# Patient Record
Sex: Female | Born: 1955
Health system: Southern US, Community
[De-identification: ages and names within clinical notes are randomized; demographics above are authoritative.]

## PROBLEM LIST (undated history)

## (undated) DIAGNOSIS — E119 Type 2 diabetes mellitus without complications: Secondary | ICD-10-CM

## (undated) DIAGNOSIS — E7800 Pure hypercholesterolemia, unspecified: Secondary | ICD-10-CM

## (undated) DIAGNOSIS — I10 Essential (primary) hypertension: Secondary | ICD-10-CM

## (undated) HISTORY — DX: Type 2 diabetes mellitus without complications: E11.9

## (undated) HISTORY — PX: ABDOMINAL HYSTERECTOMY: SHX81

## (undated) HISTORY — DX: Pure hypercholesterolemia, unspecified: E78.00

## (undated) HISTORY — PX: CHOLECYSTECTOMY: SHX55

## (undated) HISTORY — DX: Essential (primary) hypertension: I10

---

## 2001-12-09 ENCOUNTER — Ambulatory Visit (HOSPITAL_COMMUNITY): Admission: RE | Admit: 2001-12-09 | Discharge: 2001-12-09 | Payer: Self-pay | Admitting: Obstetrics and Gynecology

## 2001-12-09 ENCOUNTER — Encounter: Payer: Self-pay | Admitting: Obstetrics and Gynecology

## 2002-07-13 ENCOUNTER — Inpatient Hospital Stay (HOSPITAL_COMMUNITY): Admission: RE | Admit: 2002-07-13 | Discharge: 2002-07-15 | Payer: Self-pay | Admitting: Obstetrics & Gynecology

## 2002-12-13 ENCOUNTER — Encounter: Payer: Self-pay | Admitting: Obstetrics & Gynecology

## 2002-12-13 ENCOUNTER — Ambulatory Visit (HOSPITAL_COMMUNITY): Admission: RE | Admit: 2002-12-13 | Discharge: 2002-12-13 | Payer: Self-pay | Admitting: Obstetrics & Gynecology

## 2003-12-21 ENCOUNTER — Ambulatory Visit (HOSPITAL_COMMUNITY): Admission: RE | Admit: 2003-12-21 | Discharge: 2003-12-21 | Payer: Self-pay | Admitting: Obstetrics & Gynecology

## 2004-08-03 ENCOUNTER — Ambulatory Visit (HOSPITAL_COMMUNITY): Admission: RE | Admit: 2004-08-03 | Discharge: 2004-08-03 | Payer: Self-pay | Admitting: Family Medicine

## 2004-12-25 ENCOUNTER — Ambulatory Visit (HOSPITAL_COMMUNITY): Admission: RE | Admit: 2004-12-25 | Discharge: 2004-12-25 | Payer: Self-pay | Admitting: Obstetrics & Gynecology

## 2005-12-26 ENCOUNTER — Ambulatory Visit (HOSPITAL_COMMUNITY): Admission: RE | Admit: 2005-12-26 | Discharge: 2005-12-26 | Payer: Self-pay | Admitting: Obstetrics & Gynecology

## 2006-12-29 ENCOUNTER — Ambulatory Visit (HOSPITAL_COMMUNITY): Admission: RE | Admit: 2006-12-29 | Discharge: 2006-12-29 | Payer: Self-pay | Admitting: Obstetrics & Gynecology

## 2007-12-30 ENCOUNTER — Ambulatory Visit (HOSPITAL_COMMUNITY): Admission: RE | Admit: 2007-12-30 | Discharge: 2007-12-30 | Payer: Self-pay | Admitting: Obstetrics & Gynecology

## 2008-12-30 ENCOUNTER — Ambulatory Visit (HOSPITAL_COMMUNITY): Admission: RE | Admit: 2008-12-30 | Discharge: 2008-12-30 | Payer: Self-pay | Admitting: Obstetrics & Gynecology

## 2009-02-07 ENCOUNTER — Ambulatory Visit (HOSPITAL_COMMUNITY): Admission: RE | Admit: 2009-02-07 | Discharge: 2009-02-07 | Payer: Self-pay | Admitting: Internal Medicine

## 2010-01-01 ENCOUNTER — Ambulatory Visit (HOSPITAL_COMMUNITY): Admission: RE | Admit: 2010-01-01 | Discharge: 2010-01-01 | Payer: Self-pay | Admitting: Obstetrics & Gynecology

## 2010-11-26 ENCOUNTER — Other Ambulatory Visit: Payer: Self-pay | Admitting: Obstetrics & Gynecology

## 2010-11-26 DIAGNOSIS — Z139 Encounter for screening, unspecified: Secondary | ICD-10-CM

## 2010-12-14 NOTE — Op Note (Signed)
NAMETESS, POTTS                           ACCOUNT NO.:  [id number]   MEDICAL RECORD NO.:  [id number]                   PATIENT TYPE:  AMB   LOCATION:  DAY                                  FACILITY:  [facility]   PHYSICIAN:  Lazaro Arms, M.D.                DATE OF BIRTH:  09-Oct-1955   DATE OF PROCEDURE:  07/13/2002  DATE OF DISCHARGE:                                 OPERATIVE REPORT   PREOPERATIVE DIAGNOSES:  1. A 16-week size fibroid uterus.  2. Dysmenorrhea.   POSTOPERATIVE DIAGNOSES:  1. A 16-week size fibroid uterus.  2. Dysmenorrhea.   PROCEDURE:  Abdominal hysterectomy and bilateral salpingo-oophorectomy.   SURGEON:  Lazaro Arms, M.D.   ANESTHESIA:  General endotracheal.   FINDINGS:  The patient had multiple large myomata of the uterus.  The  ovaries were quite small but appeared to be normal.  The rest of the  peritoneal cavity was normal.   DESCRIPTION OF PROCEDURE:  The patient was taken to the operating room and  placed in the supine position, where she underwent general endotracheal  anesthesia.  She was then froglegged, her Foley catheter was placed  sterilely, and her vagina was prepped as well.  The patient's legs were then  taken out of the frogleg position.  Her abdomen was prepped and draped in  the usual sterile fashion.  A Pfannenstiel incision was made and carried  down sharply to the rectus fascia, which was scored in the midline and  extended laterally.  The fascia was taken off the muscle superiorly and  inferiorly without difficulty.  The muscles were divided and the peritoneal  cavity was entered.  The Balfour self-retaining retractor was placed, and  the uterus was grasped in the cornua with two long Kocher clamps.  The upper  abdomen was packed away with moist lap pads and the patient placed in  Trendelenburg position.  The left round ligament was suture ligated and cut.  The vesicouterine serosa flap on the left was created.  An avascular  window  in the infundibulopelvic ligament was made and the infundibulopelvic  ligament was clamped, cut, and doubly suture ligated.  Both ovaries were  quite small, and felt she was near menopause.  We had discussed this  preoperatively.  I then skeletonized the vessels on the left.  There was a  large posterior myoma that was really distorting the anatomy down there, and  so I grasped it with a single-tooth tenaculum and elevated it out of the way  of the vessels.  The right round ligament was then suture ligated and cut.  The peritoneum was incised.  The vesicouterine serosa flap was created, and  it was joined with the area on the left.  An avascular window as identified  and the infundibulopelvic ligament was clamped, cut, and double suture  ligated with good hemostasis.  The uterine vessels were  skeletonized.  The  left uterine vessels were clamped, cut, and suture ligated.  The right  uterine vessels were clamped, cut, and suture ligated.  One additional  pedicle was taken down, the cervix through the cardinal ligament.  The  bladder was pushed well away out of the area of surgery.  At this point each  pedicle was clamped, cut, and suture ligated.  The uterus was then amputated  so we could have better visualization as the uterus was really occupying the  entire pelvis from sidewall to sidewall.  I then grasped the amputated  cervical stump.  Serial pedicles were made, the vagina was crossclamped, and  each of these pedicles was clamped, cut, and suture ligated.  Vaginal angle  sutures were placed.  The vagina was closed with interrupted figure-of-eight  sutures.  The cervix was handed off along with the other specimens.  The  vagina was hemostatic.  The pelvis was irrigated vigorously and all pedicles  inspected and found to be hemostatic.  The self-retaining retractor and the  lap pads were removed.  The muscles and peritoneum were reapproximated  loosely using 0 chromic, the fascia  was closed using 0 Vicryl running, the  subcutaneous tissue was irrigated and made hemostatic.  The skin was closed  using skin staples.  The patient tolerated the procedure well.  She  experienced 300 cc of blood loss.  She was taken to the recovery room in  good and stable condition.  All counts were correct, and the specimen was  sent to the lab.                                               Lazaro Arms, M.D.    Loraine Maple  D:  07/13/2002  T:  07/13/2002  Job:  [id number]

## 2010-12-14 NOTE — H&P (Signed)
   NAME:  Martha Solomon, Martha Solomon                           ACCOUNT NO.:  [id number]   MEDICAL RECORD NO.:  [id number]                   PATIENT TYPE:  AMB   LOCATION:  DAY                                  FACILITY:  [facility]   PHYSICIAN:  Lazaro Arms, M.D.                DATE OF BIRTH:  [birth year]   DATE OF ADMISSION:  DATE OF DISCHARGE:                                HISTORY & PHYSICAL   HISTORY OF PRESENT ILLNESS:  The patient is a 55 year old African-American  female who was admitted for enlarged fibroid uterus.  She has had basically  a stable-sized uterus for some time, approximately 16 weeks' size, but it is  becoming increasingly more uncomfortable both with chronic abdominal pain,  urinary tract symptoms, and bleeding and pain with bleeding.  As a result,  she is admitted for an abdominal hysterectomy and possible BSO depending on  the status of her ovaries at the time of surgery.   PAST MEDICAL HISTORY:  1. Hypercholesterolemia.  2. Seasonal allergies.   PAST SURGICAL HISTORY:  Laparoscopic cholecystectomy and a laparoscopic  tubal ligation.   ALLERGIES:  REGLAN and DIMETAPP.   MEDICATIONS:  1. Lipitor.  2. Occasional 0.5 mg of Xanax.   SOCIAL HISTORY:  She does not smoke, drink, or do drugs.  She works full-  time.   PHYSICAL EXAMINATION:  VITAL SIGNS:  Weight is 188 pounds.  Blood pressure  120/90.  HEENT:  Normal.  NECK:  Thyroid is unremarkable.  CHEST:  Lungs are clear.  CARDIAC:  Heart is regular rate and rhythm without murmurs, rubs, or  gallops.  BREASTS:  Without mass, discharge, skin changes.  ABDOMEN:  Benign, no hepatosplenomegaly or masses.  PELVIC:  She has normal external genitalia.  Vagina is pink and moist,  without discharge.  Cervix parous, without lesion.  The uterus is 16 weeks'  size.  Adnexa is negative.  EXTREMITIES:  Warm, no edema.  NEUROLOGIC:  Grossly intact.   IMPRESSION:  1. Enlarged fibroid uterus.  2. Increasing symptomatology.   PLAN:  The patient is admitted for abdominal hysterectomy.  She understands  the risks, benefits, indications, and alternatives and will proceed.  We  will do a bilateral salpingo-oophorectomy if it appears the ovaries are  starting to fail.                                                Lazaro Arms, M.D.    Loraine Maple  D:  07/12/2002  T:  07/12/2002  Job:  [id number]

## 2010-12-14 NOTE — Discharge Summary (Signed)
   NAMERAZIA, SCREWS                           ACCOUNT NO.:  [id number]   MEDICAL RECORD NO.:  [id number]                   PATIENT TYPE:  INP   LOCATION:  A418                                 FACILITY:  [facility]   PHYSICIAN:  Lazaro Arms, M.D.                DATE OF BIRTH:  [birth year]   DATE OF ADMISSION:  07/13/2002  DATE OF DISCHARGE:  07/15/2002                                 DISCHARGE SUMMARY   DISCHARGE DIAGNOSES:  1. Status post total abdominal hysterectomy and bilateral salpingo-     oophorectomy.  2. Unremarkable postoperative course.   PROCEDURE:  Total abdominal hysterectomy and bilateral salpingo-  oophorectomy.   BRIEF HISTORY:  Please refer to the transcribed history and physical and the  operative note for details on admission to the hospital.   HOSPITAL COURSE:  The patient was admitted postoperatively.  She had  unremarkable postoperative course.  She tolerated clear liquids and a  regular diet and voided without symptoms.  She ambulated without symptoms  and had return of normal bowel function.  Her abdominal examination was  benign.  Her incision was clean, dry and intact.  She was discharged to home  on the morning of postoperative day #2 in good and stable condition.   FOLLOW UP:  She will follow up in the office next Tuesday to have her  staples removed.  She is given instructions and precautions for return.   DISCHARGE MEDICATIONS:  She is given a prescription for Toradol and Tylox, a  Vivelle dot patches and Levaquin.                                               Lazaro Arms, M.D.    Loraine Maple  D:  07/15/2002  T:  07/15/2002  Job:  [id number]

## 2011-01-07 ENCOUNTER — Ambulatory Visit (HOSPITAL_COMMUNITY)
Admission: RE | Admit: 2011-01-07 | Discharge: 2011-01-07 | Disposition: A | Discharge status: Home / Self Care | Payer: 59 | Source: Ambulatory Visit | Attending: Obstetrics & Gynecology | Admitting: Obstetrics & Gynecology

## 2011-01-07 DIAGNOSIS — Z1231 Encounter for screening mammogram for malignant neoplasm of breast: Secondary | ICD-10-CM | POA: Insufficient documentation

## 2011-01-07 DIAGNOSIS — Z139 Encounter for screening, unspecified: Secondary | ICD-10-CM

## 2011-12-10 ENCOUNTER — Other Ambulatory Visit: Payer: Self-pay | Admitting: Obstetrics & Gynecology

## 2011-12-10 DIAGNOSIS — Z139 Encounter for screening, unspecified: Secondary | ICD-10-CM

## 2012-01-13 ENCOUNTER — Ambulatory Visit (HOSPITAL_COMMUNITY)
Admission: RE | Admit: 2012-01-13 | Discharge: 2012-01-13 | Disposition: A | Discharge status: Home / Self Care | Payer: 59 | Source: Ambulatory Visit | Attending: Obstetrics & Gynecology | Admitting: Obstetrics & Gynecology

## 2012-01-13 DIAGNOSIS — Z1231 Encounter for screening mammogram for malignant neoplasm of breast: Secondary | ICD-10-CM | POA: Insufficient documentation

## 2012-01-13 DIAGNOSIS — Z139 Encounter for screening, unspecified: Secondary | ICD-10-CM

## 2012-01-20 ENCOUNTER — Other Ambulatory Visit: Payer: Self-pay | Admitting: Obstetrics & Gynecology

## 2012-01-20 DIAGNOSIS — R928 Other abnormal and inconclusive findings on diagnostic imaging of breast: Secondary | ICD-10-CM

## 2012-02-05 ENCOUNTER — Ambulatory Visit (HOSPITAL_COMMUNITY)
Admission: RE | Admit: 2012-02-05 | Discharge: 2012-02-05 | Disposition: A | Discharge status: Home / Self Care | Payer: 59 | Source: Ambulatory Visit | Attending: Obstetrics & Gynecology | Admitting: Obstetrics & Gynecology

## 2012-02-05 ENCOUNTER — Other Ambulatory Visit (HOSPITAL_COMMUNITY): Payer: Self-pay | Admitting: Obstetrics & Gynecology

## 2012-02-05 DIAGNOSIS — R928 Other abnormal and inconclusive findings on diagnostic imaging of breast: Secondary | ICD-10-CM

## 2012-07-01 ENCOUNTER — Other Ambulatory Visit: Payer: Self-pay | Admitting: Obstetrics & Gynecology

## 2012-07-01 DIAGNOSIS — Z09 Encounter for follow-up examination after completed treatment for conditions other than malignant neoplasm: Secondary | ICD-10-CM

## 2012-08-12 ENCOUNTER — Ambulatory Visit (HOSPITAL_COMMUNITY)
Admission: RE | Admit: 2012-08-12 | Discharge: 2012-08-12 | Disposition: A | Discharge status: Home / Self Care | Payer: 59 | Source: Ambulatory Visit | Attending: Obstetrics & Gynecology | Admitting: Obstetrics & Gynecology

## 2012-08-12 ENCOUNTER — Other Ambulatory Visit: Payer: Self-pay | Admitting: Obstetrics & Gynecology

## 2012-08-12 DIAGNOSIS — Z09 Encounter for follow-up examination after completed treatment for conditions other than malignant neoplasm: Secondary | ICD-10-CM

## 2012-08-12 DIAGNOSIS — N63 Unspecified lump in unspecified breast: Secondary | ICD-10-CM | POA: Insufficient documentation

## 2013-01-27 ENCOUNTER — Other Ambulatory Visit: Payer: Self-pay | Admitting: Obstetrics & Gynecology

## 2013-01-27 DIAGNOSIS — R928 Other abnormal and inconclusive findings on diagnostic imaging of breast: Secondary | ICD-10-CM

## 2013-02-10 ENCOUNTER — Ambulatory Visit (HOSPITAL_COMMUNITY)
Admission: RE | Admit: 2013-02-10 | Discharge: 2013-02-10 | Disposition: A | Discharge status: Home / Self Care | Payer: 59 | Source: Ambulatory Visit | Attending: Obstetrics & Gynecology | Admitting: Obstetrics & Gynecology

## 2013-02-10 ENCOUNTER — Other Ambulatory Visit: Payer: Self-pay | Admitting: Obstetrics & Gynecology

## 2013-02-10 DIAGNOSIS — R928 Other abnormal and inconclusive findings on diagnostic imaging of breast: Secondary | ICD-10-CM | POA: Insufficient documentation

## 2013-03-05 ENCOUNTER — Ambulatory Visit (INDEPENDENT_AMBULATORY_CARE_PROVIDER_SITE_OTHER): Payer: Commercial Managed Care - PPO | Admitting: Obstetrics & Gynecology

## 2013-03-05 ENCOUNTER — Encounter: Payer: Self-pay | Admitting: Obstetrics & Gynecology

## 2013-03-05 VITALS — BP 128/80 | Ht 65.5 in | Wt 206.0 lb

## 2013-03-05 DIAGNOSIS — I10 Essential (primary) hypertension: Secondary | ICD-10-CM | POA: Insufficient documentation

## 2013-03-05 DIAGNOSIS — Z1212 Encounter for screening for malignant neoplasm of rectum: Secondary | ICD-10-CM

## 2013-03-05 DIAGNOSIS — Z01419 Encounter for gynecological examination (general) (routine) without abnormal findings: Secondary | ICD-10-CM

## 2013-03-05 MED ORDER — ESTRADIOL 0.1 MG/24HR TD PTTW: MEDICATED_PATCH | TRANSDERMAL | Status: DC

## 2013-03-05 NOTE — Progress Notes (Signed)
Patient ID: Martha Solomon, female   DOB: [birth year], 57 y.o.   MRN: [id number] Subjective:     Martha Solomon is a 57 y.o. female here for a routine exam.  No LMP recorded. Patient has had a hysterectomy. No obstetric history on file. Current complaints: none.  Personal health questionnaire reviewed: no.   Gynecologic History No LMP recorded. Patient has had a hysterectomy. Contraception: status post hysterectomy Last Pap: na. Results were: normal Last mammogram: 2014. Results were: normal  Obstetric History OB History   Grav Para Term Preterm Abortions TAB SAB Ect Mult Living                   The following portions of the patient's history were reviewed and updated as appropriate: allergies, current medications, past family history, past medical history, past social history, past surgical history and problem list.  Review of Systems  Review of Systems  Constitutional: Negative for fever, chills, weight loss, malaise/fatigue and diaphoresis.  HENT: Negative for hearing loss, ear pain, nosebleeds, congestion, sore throat, neck pain, tinnitus and ear discharge.   Eyes: Negative for blurred vision, double vision, photophobia, pain, discharge and redness.  Respiratory: Negative for cough, hemoptysis, sputum production, shortness of breath, wheezing and stridor.   Cardiovascular: Negative for chest pain, palpitations, orthopnea, claudication, leg swelling and PND.  Gastrointestinal: negative for abdominal pain. Negative for heartburn, nausea, vomiting, diarrhea, constipation, blood in stool and melena.  Genitourinary: Negative for dysuria, urgency, frequency, hematuria and flank pain.  Musculoskeletal: Negative for myalgias, back pain, joint pain and falls.  Skin: Negative for itching and rash.  Neurological: Negative for dizziness, tingling, tremors, sensory change, speech change, focal weakness, seizures, loss of consciousness, weakness and headaches.  Endo/Heme/Allergies: Negative  for environmental allergies and polydipsia. Does not bruise/bleed easily.  Psychiatric/Behavioral: Negative for depression, suicidal ideas, hallucinations, memory loss and substance abuse. The patient is not nervous/anxious and does not have insomnia.        Objective:    Physical Exam  Vitals reviewed. Constitutional: She is oriented to person, place, and time. She appears well-developed and well-nourished.  HENT:  Head: Normocephalic and atraumatic.        Right Ear: External ear normal.  Left Ear: External ear normal.  Nose: Nose normal.  Mouth/Throat: Oropharynx is clear and moist.  Eyes: Conjunctivae and EOM are normal. Pupils are equal, round, and reactive to light. Right eye exhibits no discharge. Left eye exhibits no discharge. No scleral icterus.  Neck: Normal range of motion. Neck supple. No tracheal deviation present. No thyromegaly present.  Cardiovascular: Normal rate, regular rhythm, normal heart sounds and intact distal pulses.  Exam reveals no gallop and no friction rub.   No murmur heard. Respiratory: Effort normal and breath sounds normal. No respiratory distress. She has no wheezes. She has no rales. She exhibits no tenderness.  GI: Soft. Bowel sounds are normal. She exhibits no distension and no mass. There is no tenderness. There is no rebound and no guarding.  Genitourinary:       Vulva is normal without lesions Vagina is pink moist without discharge Cervix surgically absent Uterus is absent Adnexa is negative  Rectal negative HemeOccult, no masses normal tone Musculoskeletal: Normal range of motion. She exhibits no edema and no tenderness.  Neurological: She is alert and oriented to person, place, and time. She has normal reflexes. She displays normal reflexes. No cranial nerve deficit. She exhibits normal muscle tone. Coordination normal.  Skin: Skin is warm  and dry. No rash noted. No erythema. No pallor.  Psychiatric: She has a normal mood and affect. Her  behavior is normal. Judgment and thought content normal.       Assessment:    Healthy female exam.    Plan:    Hormone replacement therapy: hormone replacement therapy: vivelle dot. Follow up in: 1 year.

## 2014-01-05 ENCOUNTER — Other Ambulatory Visit: Payer: Self-pay | Admitting: Obstetrics & Gynecology

## 2014-01-05 DIAGNOSIS — Z139 Encounter for screening, unspecified: Secondary | ICD-10-CM

## 2014-02-11 ENCOUNTER — Ambulatory Visit (HOSPITAL_COMMUNITY): Payer: 59

## 2014-02-11 ENCOUNTER — Ambulatory Visit (HOSPITAL_COMMUNITY)
Admission: RE | Admit: 2014-02-11 | Discharge: 2014-02-11 | Disposition: A | Discharge status: Home / Self Care | Payer: 59 | Source: Ambulatory Visit | Attending: Obstetrics & Gynecology | Admitting: Obstetrics & Gynecology

## 2014-02-11 DIAGNOSIS — Z1231 Encounter for screening mammogram for malignant neoplasm of breast: Secondary | ICD-10-CM | POA: Insufficient documentation

## 2014-02-11 DIAGNOSIS — Z139 Encounter for screening, unspecified: Secondary | ICD-10-CM

## 2014-03-10 ENCOUNTER — Other Ambulatory Visit: Payer: Self-pay | Admitting: Obstetrics & Gynecology

## 2014-03-10 ENCOUNTER — Encounter: Payer: Self-pay | Admitting: Obstetrics & Gynecology

## 2014-03-10 ENCOUNTER — Ambulatory Visit (INDEPENDENT_AMBULATORY_CARE_PROVIDER_SITE_OTHER): Payer: 59 | Admitting: Obstetrics & Gynecology

## 2014-03-10 VITALS — BP 140/90 | Ht 65.2 in | Wt 202.0 lb

## 2014-03-10 DIAGNOSIS — Z1212 Encounter for screening for malignant neoplasm of rectum: Secondary | ICD-10-CM

## 2014-03-10 DIAGNOSIS — Z01419 Encounter for gynecological examination (general) (routine) without abnormal findings: Secondary | ICD-10-CM

## 2014-03-10 MED ORDER — ESTRADIOL 0.1 MG/24HR TD PTTW: MEDICATED_PATCH | TRANSDERMAL | Status: DC

## 2014-03-10 NOTE — Progress Notes (Signed)
Patient ID: Martha Solomon, female   DOB: Nov 27, 1955, 58 y.o.   MRN: [id number] Subjective:     Martha Solomon is a 58 y.o. female here for a routine exam.  No LMP recorded. Patient has had a hysterectomy. No obstetric history on file. Birth Control Method:  hysterectomy Menstrual Calendar(currently): na  Current complaints: none.   Current acute medical issues:  none   Recent Gynecologic History No LMP recorded. Patient has had a hysterectomy. Last Pap: na,   Last mammogram: 2015,  normal  Past Medical History  Diagnosis Date  . Hypertension     Past Surgical History  Procedure Laterality Date  . Abdominal hysterectomy    . Cholecystectomy      OB History   Grav Para Term Preterm Abortions TAB SAB Ect Mult Living                  History   Social History  . Marital Status: Married    Spouse Name: N/A    Number of Children: N/A  . Years of Education: N/A   Social History Main Topics  . Smoking status: Never Smoker   . Smokeless tobacco: None  . Alcohol Use: No     Comment: occassional  . Drug Use: No  . Sexual Activity: Yes   Other Topics Concern  . None   Social History Narrative  . None    Family History  Problem Relation Age of Onset  . Cancer Paternal Aunt     ovarian, breast and pancreatic     Review of Systems  Review of Systems  Constitutional: Negative for fever, chills, weight loss, malaise/fatigue and diaphoresis.  HENT: Negative for hearing loss, ear pain, nosebleeds, congestion, sore throat, neck pain, tinnitus and ear discharge.   Eyes: Negative for blurred vision, double vision, photophobia, pain, discharge and redness.  Respiratory: Negative for cough, hemoptysis, sputum production, shortness of breath, wheezing and stridor.   Cardiovascular: Negative for chest pain, palpitations, orthopnea, claudication, leg swelling and PND.  Gastrointestinal: negative for abdominal pain. Negative for heartburn, nausea, vomiting, diarrhea,  constipation, blood in stool and melena.  Genitourinary: Negative for dysuria, urgency, frequency, hematuria and flank pain.  Musculoskeletal: Negative for myalgias, back pain, joint pain and falls.  Skin: Negative for itching and rash.  Neurological: Negative for dizziness, tingling, tremors, sensory change, speech change, focal weakness, seizures, loss of consciousness, weakness and headaches.  Endo/Heme/Allergies: Negative for environmental allergies and polydipsia. Does not bruise/bleed easily.  Psychiatric/Behavioral: Negative for depression, suicidal ideas, hallucinations, memory loss and substance abuse. The patient is not nervous/anxious and does not have insomnia.        Objective:    Physical Exam  Vitals reviewed. Constitutional: She is oriented to person, place, and time. She appears well-developed and well-nourished.  HENT:  Head: Normocephalic and atraumatic.        Right Ear: External ear normal.  Left Ear: External ear normal.  Nose: Nose normal.  Mouth/Throat: Oropharynx is clear and moist.  Eyes: Conjunctivae and EOM are normal. Pupils are equal, round, and reactive to light. Right eye exhibits no discharge. Left eye exhibits no discharge. No scleral icterus.  Neck: Normal range of motion. Neck supple. No tracheal deviation present. No thyromegaly present.  Cardiovascular: Normal rate, regular rhythm, normal heart sounds and intact distal pulses.  Exam reveals no gallop and no friction rub.   No murmur heard. Respiratory: Effort normal and breath sounds normal. No respiratory distress. She has no wheezes.  She has no rales. She exhibits no tenderness.  GI: Soft. Bowel sounds are normal. She exhibits no distension and no mass. There is no tenderness. There is no rebound and no guarding.  Genitourinary:  Breasts no masses skin changes or nipple changes bilaterally      Vulva is normal without lesions Vagina is pink moist without discharge Cervix absent Uterus is  absent Adnexa is negative absent Rectal    hemoccult negative, normal tone, no masses  Musculoskeletal: Normal range of motion. She exhibits no edema and no tenderness.  Neurological: She is alert and oriented to person, place, and time. She has normal reflexes. She displays normal reflexes. No cranial nerve deficit. She exhibits normal muscle tone. Coordination normal.  Skin: Skin is warm and dry. No rash noted. No erythema. No pallor.  Psychiatric: She has a normal mood and affect. Her behavior is normal. Judgment and thought content normal.       Assessment:    Healthy female exam.    Plan:    Mammogram ordered. Follow up in: 1 year.

## 2015-01-09 ENCOUNTER — Other Ambulatory Visit: Payer: Self-pay | Admitting: Obstetrics & Gynecology

## 2015-01-09 DIAGNOSIS — Z1231 Encounter for screening mammogram for malignant neoplasm of breast: Secondary | ICD-10-CM

## 2015-02-13 ENCOUNTER — Other Ambulatory Visit (HOSPITAL_COMMUNITY): Payer: Self-pay | Admitting: Family Medicine

## 2015-02-13 ENCOUNTER — Ambulatory Visit (HOSPITAL_COMMUNITY)
Admission: RE | Admit: 2015-02-13 | Discharge: 2015-02-13 | Disposition: A | Discharge status: Home / Self Care | Payer: Commercial Managed Care - HMO | Source: Ambulatory Visit | Attending: Family Medicine | Admitting: Family Medicine

## 2015-02-13 DIAGNOSIS — R0989 Other specified symptoms and signs involving the circulatory and respiratory systems: Secondary | ICD-10-CM | POA: Insufficient documentation

## 2015-02-13 DIAGNOSIS — R05 Cough: Secondary | ICD-10-CM | POA: Diagnosis present

## 2015-02-13 DIAGNOSIS — J069 Acute upper respiratory infection, unspecified: Secondary | ICD-10-CM

## 2015-02-13 DIAGNOSIS — R059 Cough, unspecified: Secondary | ICD-10-CM

## 2015-02-15 ENCOUNTER — Ambulatory Visit (HOSPITAL_COMMUNITY)
Admission: RE | Admit: 2015-02-15 | Discharge: 2015-02-15 | Disposition: A | Discharge status: Home / Self Care | Payer: Commercial Managed Care - HMO | Source: Ambulatory Visit | Attending: Obstetrics & Gynecology | Admitting: Obstetrics & Gynecology

## 2015-02-15 DIAGNOSIS — Z1231 Encounter for screening mammogram for malignant neoplasm of breast: Secondary | ICD-10-CM | POA: Diagnosis present

## 2015-03-16 ENCOUNTER — Ambulatory Visit (INDEPENDENT_AMBULATORY_CARE_PROVIDER_SITE_OTHER): Payer: Commercial Managed Care - HMO | Admitting: Obstetrics & Gynecology

## 2015-03-16 ENCOUNTER — Encounter: Payer: Self-pay | Admitting: Obstetrics & Gynecology

## 2015-03-16 VITALS — BP 128/80 | HR 60 | Ht 65.5 in | Wt 202.5 lb

## 2015-03-16 DIAGNOSIS — Z1212 Encounter for screening for malignant neoplasm of rectum: Secondary | ICD-10-CM

## 2015-03-16 DIAGNOSIS — Z01419 Encounter for gynecological examination (general) (routine) without abnormal findings: Secondary | ICD-10-CM

## 2015-03-16 DIAGNOSIS — Z1211 Encounter for screening for malignant neoplasm of colon: Secondary | ICD-10-CM

## 2015-03-16 MED ORDER — ESTRADIOL 0.1 MG/24HR TD PTTW: MEDICATED_PATCH | TRANSDERMAL | Status: DC

## 2015-03-16 NOTE — Progress Notes (Signed)
Patient ID: Martha Solomon, female   DOB: Mar 16, 1956, 59 y.o.   MRN: [id number] Subjective:     Martha Solomon is a 59 y.o. female here for a routine exam.  No LMP recorded. Patient has had a hysterectomy. No obstetric history on file. Birth Control Method:  na Menstrual Calendar(currently): na  Current complaints: none.   Current acute medical issues:  Reactive airway disease   Recent Gynecologic History No LMP recorded. Patient has had a hysterectomy. Last Pap: ,   Last mammogram: 2016,  normal  Past Medical History  Diagnosis Date  . Hypertension     Past Surgical History  Procedure Laterality Date  . Abdominal hysterectomy    . Cholecystectomy      OB History    No data available      Social History   Social History  . Marital Status: Married    Spouse Name: N/A  . Number of Children: N/A  . Years of Education: N/A   Social History Main Topics  . Smoking status: Never Smoker   . Smokeless tobacco: Never Used  . Alcohol Use: No     Comment: occassional  . Drug Use: No  . Sexual Activity: Yes   Other Topics Concern  . None   Social History Narrative    Family History  Problem Relation Age of Onset  . Cancer Paternal Aunt     ovarian, breast and pancreatic  . Cancer Father     lung  . Stroke Father      Current outpatient prescriptions:  .  albuterol (PROVENTIL) 4 MG tablet, Take 4 mg by mouth 2 (two) times daily. , Disp: , Rfl:  .  atorvastatin (LIPITOR) 20 MG tablet, Take 20 mg by mouth daily., Disp: , Rfl:  .  estradiol (VIVELLE-DOT) 0.1 MG/24HR patch, CHANGE PATCH TWICE WEEKLY, Disp: 8 patch, Rfl: 12 .  KRILL OIL PO, Take 1 capsule by mouth daily., Disp: , Rfl:  .  ASTAXANTHIN PO, Take 10 mg by mouth daily., Disp: , Rfl:  .  lisinopril-hydrochlorothiazide (PRINZIDE,ZESTORETIC) 10-12.5 MG per tablet, Take 1 tablet by mouth daily., Disp: , Rfl:  .  losartan-hydrochlorothiazide (HYZAAR) 100-12.5 MG per tablet, Take 1 tablet by mouth daily. ,  Disp: , Rfl:   Review of Systems  Review of Systems  Constitutional: Negative for fever, chills, weight loss, malaise/fatigue and diaphoresis.  HENT: Negative for hearing loss, ear pain, nosebleeds, congestion, sore throat, neck pain, tinnitus and ear discharge.   Eyes: Negative for blurred vision, double vision, photophobia, pain, discharge and redness.  Respiratory: Negative for cough, hemoptysis, sputum production, shortness of breath, wheezing and stridor.   Cardiovascular: Negative for chest pain, palpitations, orthopnea, claudication, leg swelling and PND.  Gastrointestinal: negative for abdominal pain. Negative for heartburn, nausea, vomiting, diarrhea, constipation, blood in stool and melena.  Genitourinary: Negative for dysuria, urgency, frequency, hematuria and flank pain.  Musculoskeletal: Negative for myalgias, back pain, joint pain and falls.  Skin: Negative for itching and rash.  Neurological: Negative for dizziness, tingling, tremors, sensory change, speech change, focal weakness, seizures, loss of consciousness, weakness and headaches.  Endo/Heme/Allergies: Negative for environmental allergies and polydipsia. Does not bruise/bleed easily.  Psychiatric/Behavioral: Negative for depression, suicidal ideas, hallucinations, memory loss and substance abuse. The patient is not nervous/anxious and does not have insomnia.        Objective:  Blood pressure 128/80, pulse 60, height 5' 5.5" (1.664 m), weight 202 lb 8 oz (91.853 kg).   Physical  Exam  Vitals reviewed. Constitutional: She is oriented to person, place, and time. She appears well-developed and well-nourished.  HENT:  Head: Normocephalic and atraumatic.        Right Ear: External ear normal.  Left Ear: External ear normal.  Nose: Nose normal.  Mouth/Throat: Oropharynx is clear and moist.  Eyes: Conjunctivae and EOM are normal. Pupils are equal, round, and reactive to light. Right eye exhibits no discharge. Left eye  exhibits no discharge. No scleral icterus.  Neck: Normal range of motion. Neck supple. No tracheal deviation present. No thyromegaly present.  Cardiovascular: Normal rate, regular rhythm, normal heart sounds and intact distal pulses.  Exam reveals no gallop and no friction rub.   No murmur heard. Respiratory: Effort normal and breath sounds normal. No respiratory distress. She has no wheezes. She has no rales. She exhibits no tenderness.   GI: Soft. Bowel sounds are normal. She exhibits no distension and no mass. There is no tenderness. There is no rebound and no guarding.  Genitourinary:  Breasts no masses skin changes or nipple changes bilaterally      Vulva is normal without lesions Vagina is pink moist without discharge Cervix absent Uterus is absent Adnexa is negative  {Rectal    hemoccult negative, normal tone, no masses Musculoskeletal: Normal range of motion. She exhibits no edema and no tenderness.  Neurological: She is alert and oriented to person, place, and time. She has normal reflexes. She displays normal reflexes. No cranial nerve deficit. She exhibits normal muscle tone. Coordination normal.  Skin: Skin is warm and dry. No rash noted. No erythema. No pallor.  Psychiatric: She has a normal mood and affect. Her behavior is normal. Judgment and thought content normal.       Assessment:    Healthy female exam.    Plan:    Hormone replacement therapy: hormone replacement therapy: vivelle dot 0.1 mg. Mammogram ordered. Follow up in: 1 year.

## 2016-01-01 ENCOUNTER — Other Ambulatory Visit: Payer: Self-pay | Admitting: Obstetrics & Gynecology

## 2016-01-01 DIAGNOSIS — Z1231 Encounter for screening mammogram for malignant neoplasm of breast: Secondary | ICD-10-CM

## 2016-02-19 ENCOUNTER — Ambulatory Visit (HOSPITAL_COMMUNITY)
Admission: RE | Admit: 2016-02-19 | Discharge: 2016-02-19 | Disposition: A | Discharge status: Home / Self Care | Payer: Commercial Managed Care - HMO | Source: Ambulatory Visit | Attending: Obstetrics & Gynecology | Admitting: Obstetrics & Gynecology

## 2016-02-19 DIAGNOSIS — Z1231 Encounter for screening mammogram for malignant neoplasm of breast: Secondary | ICD-10-CM | POA: Diagnosis present

## 2016-03-15 ENCOUNTER — Other Ambulatory Visit: Payer: Self-pay | Admitting: Obstetrics & Gynecology

## 2016-03-21 ENCOUNTER — Ambulatory Visit (INDEPENDENT_AMBULATORY_CARE_PROVIDER_SITE_OTHER): Payer: Commercial Managed Care - HMO | Admitting: Obstetrics & Gynecology

## 2016-03-21 ENCOUNTER — Encounter: Payer: Self-pay | Admitting: Obstetrics & Gynecology

## 2016-03-21 VITALS — BP 126/80 | HR 80 | Ht 65.2 in | Wt 204.0 lb

## 2016-03-21 DIAGNOSIS — Z01419 Encounter for gynecological examination (general) (routine) without abnormal findings: Secondary | ICD-10-CM

## 2016-03-21 MED ORDER — TERCONAZOLE 0.4 % VA CREA: 1.0000 | TOPICAL_CREAM | Freq: Every day | VAGINAL | 0 refills | Status: DC

## 2016-03-21 MED ORDER — VIVELLE-DOT 0.1 MG/24HR TD PTTW: MEDICATED_PATCH | TRANSDERMAL | 12 refills | Status: DC

## 2016-03-21 NOTE — Progress Notes (Signed)
Subjective:     Martha Solomon is a 60 y.o. female here for a routine exam.  No LMP recorded. Patient has had a hysterectomy. No obstetric history on file. Birth Control Method:  hysterectomy Menstrual Calendar(currently): none  Current complaints: yeast infection.   Current acute medical issues:  none   Recent Gynecologic History No LMP recorded. Patient has had a hysterectomy. Last Pap: na,   Last mammogram: 2017,  normal  Past Medical History:  Diagnosis Date  . Hypertension     Past Surgical History:  Procedure Laterality Date  . ABDOMINAL HYSTERECTOMY    . CHOLECYSTECTOMY      OB History    No data available      Social History   Social History  . Marital status: Married    Spouse name: N/A  . Number of children: N/A  . Years of education: N/A   Social History Main Topics  . Smoking status: Never Smoker  . Smokeless tobacco: Never Used  . Alcohol use No     Comment: occassional  . Drug use: No  . Sexual activity: Yes   Other Topics Concern  . None   Social History Narrative  . None    Family History  Problem Relation Age of Onset  . Cancer Paternal Aunt     ovarian, breast and pancreatic  . Cancer Father     lung  . Stroke Father      Current Outpatient Prescriptions:  .  ASTAXANTHIN PO, Take 10 mg by mouth daily., Disp: , Rfl:  .  atorvastatin (LIPITOR) 20 MG tablet, Take 20 mg by mouth daily., Disp: , Rfl:  .  KRILL OIL PO, Take 1 capsule by mouth daily., Disp: , Rfl:  .  lisinopril-hydrochlorothiazide (PRINZIDE,ZESTORETIC) 10-12.5 MG per tablet, Take 1 tablet by mouth daily., Disp: , Rfl:  .  losartan-hydrochlorothiazide (HYZAAR) 100-12.5 MG per tablet, Take 1 tablet by mouth daily. , Disp: , Rfl:  .  VIVELLE-DOT 0.1 MG/24HR patch, CHANGE PATCH TWICE WEEKLY, Disp: 8 patch, Rfl: 12 .  albuterol (PROVENTIL) 4 MG tablet, Take 4 mg by mouth 2 (two) times daily. , Disp: , Rfl:  .  terconazole (TERAZOL 7) 0.4 % vaginal cream, Place 1  applicator vaginally at bedtime., Disp: 45 g, Rfl: 0  Review of Systems  Review of Systems  Constitutional: Negative for fever, chills, weight loss, malaise/fatigue and diaphoresis.  HENT: Negative for hearing loss, ear pain, nosebleeds, congestion, sore throat, neck pain, tinnitus and ear discharge.   Eyes: Negative for blurred vision, double vision, photophobia, pain, discharge and redness.  Respiratory: Negative for cough, hemoptysis, sputum production, shortness of breath, wheezing and stridor.   Cardiovascular: Negative for chest pain, palpitations, orthopnea, claudication, leg swelling and PND.  Gastrointestinal: negative for abdominal pain. Negative for heartburn, nausea, vomiting, diarrhea, constipation, blood in stool and melena.  Genitourinary: Negative for dysuria, urgency, frequency, hematuria and flank pain.  Musculoskeletal: Negative for myalgias, back pain, joint pain and falls.  Skin: Negative for itching and rash.  Neurological: Negative for dizziness, tingling, tremors, sensory change, speech change, focal weakness, seizures, loss of consciousness, weakness and headaches.  Endo/Heme/Allergies: Negative for environmental allergies and polydipsia. Does not bruise/bleed easily.  Psychiatric/Behavioral: Negative for depression, suicidal ideas, hallucinations, memory loss and substance abuse. The patient is not nervous/anxious and does not have insomnia.        Objective:  Blood pressure 126/80, pulse 80, height 5' 5.2" (1.656 m), weight 204 lb (92.5 kg).  Physical Exam  Vitals reviewed. Constitutional: She is oriented to person, place, and time. She appears well-developed and well-nourished.  HENT:  Head: Normocephalic and atraumatic.        Right Ear: External ear normal.  Left Ear: External ear normal.  Nose: Nose normal.  Mouth/Throat: Oropharynx is clear and moist.  Eyes: Conjunctivae and EOM are normal. Pupils are equal, round, and reactive to light. Right eye  exhibits no discharge. Left eye exhibits no discharge. No scleral icterus.  Neck: Normal range of motion. Neck supple. No tracheal deviation present. No thyromegaly present.  Cardiovascular: Normal rate, regular rhythm, normal heart sounds and intact distal pulses.  Exam reveals no gallop and no friction rub.   No murmur heard. Respiratory: Effort normal and breath sounds normal. No respiratory distress. She has no wheezes. She has no rales. She exhibits no tenderness.  GI: Soft. Bowel sounds are normal. She exhibits no distension and no mass. There is no tenderness. There is no rebound and no guarding.  Genitourinary:  Breasts no masses skin changes or nipple changes bilaterally      Vulva is normal without lesions Vagina is pink moist without discharge Cervix absent Uterus is absent Adnexa is negative   Musculoskeletal: Normal range of motion. She exhibits no edema and no tenderness.  Neurological: She is alert and oriented to person, place, and time. She has normal reflexes. She displays normal reflexes. No cranial nerve deficit. She exhibits normal muscle tone. Coordination normal.  Skin: Skin is warm and dry. No rash noted. No erythema. No pallor.  Psychiatric: She has a normal mood and affect. Her behavior is normal. Judgment and thought content normal.       Medications Ordered at today's visit: Meds ordered this encounter  Medications  . VIVELLE-DOT 0.1 MG/24HR patch    Sig: CHANGE PATCH TWICE WEEKLY    Dispense:  8 patch    Refill:  12  . terconazole (TERAZOL 7) 0.4 % vaginal cream    Sig: Place 1 applicator vaginally at bedtime.    Dispense:  45 g    Refill:  0    Other orders placed at today's visit: No orders of the defined types were placed in this encounter.     Assessment:    Healthy female exam.    Plan:    Hormone replacement therapy: hormone replacement therapy: vivelle dot 0.1 mg. Mammogram ordered. Follow up in: 1 year.     No Follow-up on  file.

## 2016-12-30 ENCOUNTER — Other Ambulatory Visit: Payer: Self-pay | Admitting: Obstetrics & Gynecology

## 2016-12-30 DIAGNOSIS — Z1231 Encounter for screening mammogram for malignant neoplasm of breast: Secondary | ICD-10-CM

## 2017-02-03 ENCOUNTER — Ambulatory Visit (INDEPENDENT_AMBULATORY_CARE_PROVIDER_SITE_OTHER): Payer: 59 | Admitting: Otolaryngology

## 2017-02-03 DIAGNOSIS — J343 Hypertrophy of nasal turbinates: Secondary | ICD-10-CM | POA: Diagnosis not present

## 2017-02-03 DIAGNOSIS — J338 Other polyp of sinus: Secondary | ICD-10-CM | POA: Diagnosis not present

## 2017-02-03 DIAGNOSIS — H6123 Impacted cerumen, bilateral: Secondary | ICD-10-CM

## 2017-02-03 DIAGNOSIS — J310 Chronic rhinitis: Secondary | ICD-10-CM

## 2017-02-20 ENCOUNTER — Ambulatory Visit (HOSPITAL_COMMUNITY)
Admission: RE | Admit: 2017-02-20 | Discharge: 2017-02-20 | Disposition: A | Discharge status: Home / Self Care | Payer: 59 | Source: Ambulatory Visit | Attending: Obstetrics & Gynecology | Admitting: Obstetrics & Gynecology

## 2017-02-20 DIAGNOSIS — Z1231 Encounter for screening mammogram for malignant neoplasm of breast: Secondary | ICD-10-CM | POA: Diagnosis not present

## 2017-03-19 ENCOUNTER — Other Ambulatory Visit: Payer: Self-pay | Admitting: Obstetrics & Gynecology

## 2017-03-25 ENCOUNTER — Encounter: Payer: Self-pay | Admitting: Obstetrics & Gynecology

## 2017-03-25 ENCOUNTER — Ambulatory Visit (INDEPENDENT_AMBULATORY_CARE_PROVIDER_SITE_OTHER): Payer: 59 | Admitting: Obstetrics & Gynecology

## 2017-03-25 VITALS — BP 128/70 | HR 74 | Ht 65.0 in | Wt 197.5 lb

## 2017-03-25 DIAGNOSIS — Z1212 Encounter for screening for malignant neoplasm of rectum: Secondary | ICD-10-CM | POA: Diagnosis not present

## 2017-03-25 DIAGNOSIS — Z01419 Encounter for gynecological examination (general) (routine) without abnormal findings: Secondary | ICD-10-CM | POA: Diagnosis not present

## 2017-03-25 DIAGNOSIS — Z1211 Encounter for screening for malignant neoplasm of colon: Secondary | ICD-10-CM | POA: Diagnosis not present

## 2017-03-25 LAB — HEMOCCULT GUIAC POC 1CARD (OFFICE): Fecal Occult Blood, POC: NEGATIVE

## 2017-03-25 MED ORDER — VIVELLE-DOT 0.1 MG/24HR TD PTTW: MEDICATED_PATCH | TRANSDERMAL | 12 refills | Status: DC

## 2017-03-25 NOTE — Addendum Note (Signed)
Addended by: Linton Rump on: 03/25/2017 04:14 PM   Modules accepted: Orders

## 2017-03-25 NOTE — Progress Notes (Signed)
Subjective:     Martha Solomon is a 61 y.o. female here for a routine exam.  No LMP recorded. Patient has had a hysterectomy. [id number] Birth Control Method:  hysterectomy Menstrual Calendar(currently): amenorrheic  Current complaints: none.   Current acute medical issues:  none   Recent Gynecologic History No LMP recorded. Patient has had a hysterectomy. Last Pap: 2003,   Last mammogram: 02/20/2017,  normal  Past Medical History:  Diagnosis Date  . Diabetes mellitus without complication (HCC)    pre-diabetes  . High cholesterol   . Hypertension     Past Surgical History:  Procedure Laterality Date  . ABDOMINAL HYSTERECTOMY    . CHOLECYSTECTOMY      OB History    Gravida Para Term Preterm AB Living   2       2     SAB TAB Ectopic Multiple Live Births   2              Social History   Social History  . Marital status: Married    Spouse name: N/A  . Number of children: N/A  . Years of education: N/A   Social History Main Topics  . Smoking status: Never Smoker  . Smokeless tobacco: Never Used  . Alcohol use No     Comment: occassional  . Drug use: No  . Sexual activity: Not Currently    Birth control/ protection: Surgical     Comment: hyst   Other Topics Concern  . None   Social History Narrative  . None    Family History  Problem Relation Age of Onset  . Cancer Paternal Aunt        ovarian, breast and pancreatic  . Cancer Father        lung  . Stroke Father   . Heart attack Paternal Grandfather   . Dementia Mother   . High Cholesterol Brother   . High Cholesterol Sister   . HIV/AIDS Brother   . Hypertension Sister      Current Outpatient Prescriptions:  .  ASTAXANTHIN PO, Take 10 mg by mouth daily., Disp: , Rfl:  .  atorvastatin (LIPITOR) 20 MG tablet, Take 80 mg by mouth daily. , Disp: , Rfl:  .  KRILL OIL PO, Take 1 capsule by mouth daily., Disp: , Rfl:  .  losartan-hydrochlorothiazide (HYZAAR) 100-12.5 MG tablet, Take 1 tablet by mouth  daily., Disp: , Rfl:  .  vitamin C (ASCORBIC ACID) 500 MG tablet, Take 500 mg by mouth daily., Disp: , Rfl:  .  VIVELLE-DOT 0.1 MG/24HR patch, CHANGE PATCH TWICE WEEKLY, Disp: 8 patch, Rfl: 12  Review of Systems  Review of Systems  Constitutional: Negative for fever, chills, weight loss, malaise/fatigue and diaphoresis.  HENT: Negative for hearing loss, ear pain, nosebleeds, congestion, sore throat, neck pain, tinnitus and ear discharge.   Eyes: Negative for blurred vision, double vision, photophobia, pain, discharge and redness.  Respiratory: Negative for cough, hemoptysis, sputum production, shortness of breath, wheezing and stridor.   Cardiovascular: Negative for chest pain, palpitations, orthopnea, claudication, leg swelling and PND.  Gastrointestinal: negative for abdominal pain. Negative for heartburn, nausea, vomiting, diarrhea, constipation, blood in stool and melena.  Genitourinary: Negative for dysuria, urgency, frequency, hematuria and flank pain.  Musculoskeletal: Negative for myalgias, back pain, joint pain and falls.  Skin: Negative for itching and rash.  Neurological: Negative for dizziness, tingling, tremors, sensory change, speech change, focal weakness, seizures, loss of consciousness, weakness and headaches.  Endo/Heme/Allergies:  Negative for environmental allergies and polydipsia. Does not bruise/bleed easily.  Psychiatric/Behavioral: Negative for depression, suicidal ideas, hallucinations, memory loss and substance abuse. The patient is not nervous/anxious and does not have insomnia.        Objective:  Blood pressure 128/70, pulse 74, height 5\' 5"  (1.651 m), weight 197 lb 8 oz (89.6 kg).   Physical Exam  Vitals reviewed. Constitutional: She is oriented to person, place, and time. She appears well-developed and well-nourished.  HENT:  Head: Normocephalic and atraumatic.        Right Ear: External ear normal.  Left Ear: External ear normal.  Nose: Nose normal.   Mouth/Throat: Oropharynx is clear and moist.  Eyes: Conjunctivae and EOM are normal. Pupils are equal, round, and reactive to light. Right eye exhibits no discharge. Left eye exhibits no discharge. No scleral icterus.  Neck: Normal range of motion. Neck supple. No tracheal deviation present. No thyromegaly present.  Cardiovascular: Normal rate, regular rhythm, normal heart sounds and intact distal pulses.  Exam reveals no gallop and no friction rub.   No murmur heard. Respiratory: Effort normal and breath sounds normal. No respiratory distress. She has no wheezes. She has no rales. She exhibits no tenderness.  GI: Soft. Bowel sounds are normal. She exhibits no distension and no mass. There is no tenderness. There is no rebound and no guarding.  Genitourinary:  Breasts no masses skin changes or nipple changes bilaterally      Vulva is normal without lesions Vagina is pink moist without discharge Cervix absent Uterus is normal size shape and contour Adnexa is negative with normal sized ovaries  {Rectal    hemoccult negative, normal tone, no masses  Musculoskeletal: Normal range of motion. She exhibits no edema and no tenderness.  Neurological: She is alert and oriented to person, place, and time. She has normal reflexes. She displays normal reflexes. No cranial nerve deficit. She exhibits normal muscle tone. Coordination normal.  Skin: Skin is warm and dry. No rash noted. No erythema. No pallor.  Psychiatric: She has a normal mood and affect. Her behavior is normal. Judgment and thought content normal.       Medications Ordered at today's visit: Meds ordered this encounter  Medications  . losartan-hydrochlorothiazide (HYZAAR) 100-12.5 MG tablet    Sig: Take 1 tablet by mouth daily.  . vitamin C (ASCORBIC ACID) 500 MG tablet    Sig: Take 500 mg by mouth daily.    Other orders placed at today's visit: No orders of the defined types were placed in this encounter.     Assessment:     Healthy female exam.    Plan:    Contraception: status post hysterectomy. Mammogram ordered. Follow up in: 2 years.     Return in about 2 years (around 03/26/2019) for yearly, with Dr Elonda Husky.

## 2017-03-25 NOTE — Addendum Note (Signed)
Addended by: Florian Buff on: 03/25/2017 04:14 PM   Modules accepted: Orders

## 2017-04-09 DIAGNOSIS — Z23 Encounter for immunization: Secondary | ICD-10-CM | POA: Diagnosis not present

## 2017-04-09 DIAGNOSIS — E748 Other specified disorders of carbohydrate metabolism: Secondary | ICD-10-CM | POA: Diagnosis not present

## 2017-04-09 DIAGNOSIS — Z0001 Encounter for general adult medical examination with abnormal findings: Secondary | ICD-10-CM | POA: Diagnosis not present

## 2017-04-09 DIAGNOSIS — Z1389 Encounter for screening for other disorder: Secondary | ICD-10-CM | POA: Diagnosis not present

## 2017-05-12 DIAGNOSIS — D473 Essential (hemorrhagic) thrombocythemia: Secondary | ICD-10-CM | POA: Diagnosis not present

## 2017-05-12 DIAGNOSIS — Z23 Encounter for immunization: Secondary | ICD-10-CM | POA: Diagnosis not present

## 2017-08-04 ENCOUNTER — Ambulatory Visit (INDEPENDENT_AMBULATORY_CARE_PROVIDER_SITE_OTHER): Payer: 59 | Admitting: Otolaryngology

## 2017-08-04 DIAGNOSIS — H6123 Impacted cerumen, bilateral: Secondary | ICD-10-CM

## 2017-08-04 DIAGNOSIS — J330 Polyp of nasal cavity: Secondary | ICD-10-CM | POA: Diagnosis not present

## 2017-08-04 DIAGNOSIS — J310 Chronic rhinitis: Secondary | ICD-10-CM | POA: Diagnosis not present

## 2017-09-22 DIAGNOSIS — I10 Essential (primary) hypertension: Secondary | ICD-10-CM | POA: Diagnosis not present

## 2018-02-09 ENCOUNTER — Ambulatory Visit (INDEPENDENT_AMBULATORY_CARE_PROVIDER_SITE_OTHER): Payer: 59 | Admitting: Otolaryngology

## 2018-02-09 DIAGNOSIS — J343 Hypertrophy of nasal turbinates: Secondary | ICD-10-CM | POA: Diagnosis not present

## 2018-02-09 DIAGNOSIS — J310 Chronic rhinitis: Secondary | ICD-10-CM

## 2018-02-09 DIAGNOSIS — H6123 Impacted cerumen, bilateral: Secondary | ICD-10-CM

## 2018-03-26 ENCOUNTER — Other Ambulatory Visit: Payer: Self-pay | Admitting: Obstetrics & Gynecology

## 2018-03-26 DIAGNOSIS — Z1231 Encounter for screening mammogram for malignant neoplasm of breast: Secondary | ICD-10-CM

## 2018-04-06 ENCOUNTER — Encounter (HOSPITAL_COMMUNITY): Payer: Self-pay

## 2018-04-06 ENCOUNTER — Ambulatory Visit (HOSPITAL_COMMUNITY)
Admission: RE | Admit: 2018-04-06 | Discharge: 2018-04-06 | Disposition: A | Discharge status: Home / Self Care | Payer: 59 | Source: Ambulatory Visit | Attending: Obstetrics & Gynecology | Admitting: Obstetrics & Gynecology

## 2018-04-06 DIAGNOSIS — Z1231 Encounter for screening mammogram for malignant neoplasm of breast: Secondary | ICD-10-CM

## 2018-04-20 DIAGNOSIS — E7849 Other hyperlipidemia: Secondary | ICD-10-CM | POA: Diagnosis not present

## 2018-04-20 DIAGNOSIS — Z1389 Encounter for screening for other disorder: Secondary | ICD-10-CM | POA: Diagnosis not present

## 2018-04-20 DIAGNOSIS — Z6832 Body mass index (BMI) 32.0-32.9, adult: Secondary | ICD-10-CM | POA: Diagnosis not present

## 2018-04-20 DIAGNOSIS — Z23 Encounter for immunization: Secondary | ICD-10-CM | POA: Diagnosis not present

## 2018-04-20 DIAGNOSIS — Z0001 Encounter for general adult medical examination with abnormal findings: Secondary | ICD-10-CM | POA: Diagnosis not present

## 2018-04-20 DIAGNOSIS — E6609 Other obesity due to excess calories: Secondary | ICD-10-CM | POA: Diagnosis not present

## 2018-04-20 DIAGNOSIS — E748 Other specified disorders of carbohydrate metabolism: Secondary | ICD-10-CM | POA: Diagnosis not present

## 2018-04-24 ENCOUNTER — Other Ambulatory Visit: Payer: Self-pay | Admitting: Obstetrics & Gynecology

## 2018-08-17 ENCOUNTER — Ambulatory Visit (INDEPENDENT_AMBULATORY_CARE_PROVIDER_SITE_OTHER): Payer: 59 | Admitting: Otolaryngology

## 2018-08-17 DIAGNOSIS — H6123 Impacted cerumen, bilateral: Secondary | ICD-10-CM

## 2018-08-17 DIAGNOSIS — J310 Chronic rhinitis: Secondary | ICD-10-CM | POA: Diagnosis not present

## 2018-08-27 ENCOUNTER — Telehealth: Payer: Self-pay | Admitting: *Deleted

## 2018-08-27 NOTE — Telephone Encounter (Signed)
Pharmacy states that pts insurance will no longer pay for Vivelle Dot. They request that an Rx for generic be sent to their pharmacy. Advised that I would send the request to a provider.

## 2018-08-28 NOTE — Telephone Encounter (Signed)
Informed pharmacy that it is ok for generic of vivelle dot to be dispensed.

## 2018-08-28 NOTE — Telephone Encounter (Signed)
Please have patient contact her insurance company and provide a list of estrogen that they will cover, as I would prefer to keep her on a topical but dont want to just "shoot in the dark" with prescriptions

## 2018-10-26 ENCOUNTER — Other Ambulatory Visit: Payer: Self-pay | Admitting: Obstetrics & Gynecology

## 2019-09-06 ENCOUNTER — Other Ambulatory Visit (HOSPITAL_COMMUNITY): Payer: Self-pay | Admitting: Obstetrics & Gynecology

## 2019-09-06 DIAGNOSIS — Z1231 Encounter for screening mammogram for malignant neoplasm of breast: Secondary | ICD-10-CM

## 2019-09-09 ENCOUNTER — Ambulatory Visit (HOSPITAL_COMMUNITY)
Admission: RE | Admit: 2019-09-09 | Discharge: 2019-09-09 | Disposition: A | Discharge status: Home / Self Care | Payer: 59 | Source: Ambulatory Visit | Attending: Obstetrics & Gynecology | Admitting: Obstetrics & Gynecology

## 2019-09-09 ENCOUNTER — Other Ambulatory Visit: Payer: Self-pay

## 2019-09-09 DIAGNOSIS — Z1231 Encounter for screening mammogram for malignant neoplasm of breast: Secondary | ICD-10-CM | POA: Diagnosis not present

## 2019-10-11 ENCOUNTER — Other Ambulatory Visit: Payer: Self-pay

## 2019-10-11 ENCOUNTER — Encounter: Payer: Self-pay | Admitting: Obstetrics & Gynecology

## 2019-10-11 ENCOUNTER — Ambulatory Visit (INDEPENDENT_AMBULATORY_CARE_PROVIDER_SITE_OTHER): Payer: 59 | Admitting: Obstetrics & Gynecology

## 2019-10-11 DIAGNOSIS — Z90710 Acquired absence of both cervix and uterus: Secondary | ICD-10-CM | POA: Diagnosis not present

## 2019-10-11 DIAGNOSIS — Z803 Family history of malignant neoplasm of breast: Secondary | ICD-10-CM

## 2019-10-11 DIAGNOSIS — Z01419 Encounter for gynecological examination (general) (routine) without abnormal findings: Secondary | ICD-10-CM

## 2019-10-11 DIAGNOSIS — Z8041 Family history of malignant neoplasm of ovary: Secondary | ICD-10-CM

## 2019-10-11 DIAGNOSIS — Z1211 Encounter for screening for malignant neoplasm of colon: Secondary | ICD-10-CM | POA: Diagnosis not present

## 2019-10-11 DIAGNOSIS — Z1212 Encounter for screening for malignant neoplasm of rectum: Secondary | ICD-10-CM | POA: Diagnosis not present

## 2019-10-11 MED ORDER — ESTRADIOL 0.1 MG/24HR TD PTTW: MEDICATED_PATCH | TRANSDERMAL | 12 refills | Status: DC

## 2019-10-11 NOTE — Progress Notes (Signed)
Subjective:     Martha Solomon is a 64 y.o. female here for a routine exam.  No LMP recorded. Patient has had a hysterectomy. [id number] Birth Control Method:  hysterectomy Menstrual Calendar(currently): none  Current complaints: none.   Current acute medical issues:  hypertension   Recent Gynecologic History No LMP recorded. Patient has had a hysterectomy. Last Pap: 2018,  normal Last mammogram: 2021  normal  Past Medical History:  Diagnosis Date  . Diabetes mellitus without complication (HCC)    pre-diabetes  . High cholesterol   . Hypertension     Past Surgical History:  Procedure Laterality Date  . ABDOMINAL HYSTERECTOMY    . CHOLECYSTECTOMY      OB History    Gravida  2   Para      Term      Preterm      AB  2   Living        SAB  2   TAB      Ectopic      Multiple      Live Births              Social History   Socioeconomic History  . Marital status: Married    Spouse name: Not on file  . Number of children: Not on file  . Years of education: Not on file  . Highest education level: Not on file  Occupational History  . Not on file  Tobacco Use  . Smoking status: Never Smoker  . Smokeless tobacco: Never Used  Substance and Sexual Activity  . Alcohol use: No    Alcohol/week: 0.0 standard drinks    Comment: occassional  . Drug use: No  . Sexual activity: Not Currently    Birth control/protection: Surgical    Comment: hyst  Other Topics Concern  . Not on file  Social History Narrative  . Not on file   Social Determinants of Health   Financial Resource Strain:   . Difficulty of Paying Living Expenses:   Food Insecurity:   . Worried About Charity fundraiser in the Last Year:   . Arboriculturist in the Last Year:   Transportation Needs:   . Film/video editor (Medical):   Marland Kitchen Lack of Transportation (Non-Medical):   Physical Activity:   . Days of Exercise per Week:   . Minutes of Exercise per Session:   Stress:   . Feeling  of Stress :   Social Connections:   . Frequency of Communication with Friends and Family:   . Frequency of Social Gatherings with Friends and Family:   . Attends Religious Services:   . Active Member of Clubs or Organizations:   . Attends Archivist Meetings:   Marland Kitchen Marital Status:     Family History  Problem Relation Age of Onset  . Cancer Paternal Aunt        ovarian, breast and pancreatic  . Cancer Father        lung  . Stroke Father   . Heart attack Paternal Grandfather   . Dementia Mother   . High Cholesterol Brother   . High Cholesterol Sister   . HIV/AIDS Brother   . Hypertension Sister      Current Outpatient Medications:  .  ASTAXANTHIN PO, Take 10 mg by mouth daily., Disp: , Rfl:  .  atorvastatin (LIPITOR) 20 MG tablet, Take 80 mg by mouth daily. , Disp: , Rfl:  .  cholecalciferol (  VITAMIN D3) 25 MCG (1000 UNIT) tablet, Take 1,000 Units by mouth daily., Disp: , Rfl:  .  co-enzyme Q-10 30 MG capsule, Take 30 mg by mouth 3 (three) times daily., Disp: , Rfl:  .  estradiol (VIVELLE-DOT) 0.1 MG/24HR patch, APPLY 1 PATCH TWICE A WEEK, Disp: 8 patch, Rfl: 12 .  KRILL OIL PO, Take 1 capsule by mouth daily., Disp: , Rfl:  .  losartan-hydrochlorothiazide (HYZAAR) 100-12.5 MG tablet, Take 1 tablet by mouth daily., Disp: , Rfl:  .  vitamin C (ASCORBIC ACID) 500 MG tablet, Take 500 mg by mouth daily., Disp: , Rfl:   Review of Systems  Review of Systems  Constitutional: Negative for fever, chills, weight loss, malaise/fatigue and diaphoresis.  HENT: Negative for hearing loss, ear pain, nosebleeds, congestion, sore throat, neck pain, tinnitus and ear discharge.   Eyes: Negative for blurred vision, double vision, photophobia, pain, discharge and redness.  Respiratory: Negative for cough, hemoptysis, sputum production, shortness of breath, wheezing and stridor.   Cardiovascular: Negative for chest pain, palpitations, orthopnea, claudication, leg swelling and PND.   Gastrointestinal: negative for abdominal pain. Negative for heartburn, nausea, vomiting, diarrhea, constipation, blood in stool and melena.  Genitourinary: Negative for dysuria, urgency, frequency, hematuria and flank pain.  Musculoskeletal: Negative for myalgias, back pain, joint pain and falls.  Skin: Negative for itching and rash.  Neurological: Negative for dizziness, tingling, tremors, sensory change, speech change, focal weakness, seizures, loss of consciousness, weakness and headaches.  Endo/Heme/Allergies: Negative for environmental allergies and polydipsia. Does not bruise/bleed easily.  Psychiatric/Behavioral: Negative for depression, suicidal ideas, hallucinations, memory loss and substance abuse. The patient is not nervous/anxious and does not have insomnia.        Objective:  Blood pressure 126/72, pulse 76, height 5\' 5"  (1.651 m), weight 196 lb (88.9 kg).   Physical Exam  Vitals reviewed. Constitutional: She is oriented to person, place, and time. She appears well-developed and well-nourished.  HENT:  Head: Normocephalic and atraumatic.        Right Ear: External ear normal.  Left Ear: External ear normal.  Nose: Nose normal.  Mouth/Throat: Oropharynx is clear and moist.  Eyes: Conjunctivae and EOM are normal. Pupils are equal, round, and reactive to light. Right eye exhibits no discharge. Left eye exhibits no discharge. No scleral icterus.  Neck: Normal range of motion. Neck supple. No tracheal deviation present. No thyromegaly present.  Cardiovascular: Normal rate, regular rhythm, normal heart sounds and intact distal pulses.  Exam reveals no gallop and no friction rub.   No murmur heard. Respiratory: Effort normal and breath sounds normal. No respiratory distress. She has no wheezes. She has no rales. She exhibits no tenderness.  GI: Soft. Bowel sounds are normal. She exhibits no distension and no mass. There is no tenderness. There is no rebound and no guarding.   Genitourinary:  Breasts no masses skin changes or nipple changes bilaterally      Vulva is normal without lesions Vagina is pink moist without discharge Cervix absent Uterus is absent Adnexa is negative with normal sized ovaries  {Rectal    hemoccult negative, normal tone, no masses  Musculoskeletal: Normal range of motion. She exhibits no edema and no tenderness.  Neurological: She is alert and oriented to person, place, and time. She has normal reflexes. She displays normal reflexes. No cranial nerve deficit. She exhibits normal muscle tone. Coordination normal.  Skin: Skin is warm and dry. No rash noted. No erythema. No pallor.  Psychiatric: She has  a normal mood and affect. Her behavior is normal. Judgment and thought content normal.       Medications Ordered at today's visit: No orders of the defined types were placed in this encounter.   Other orders placed at today's visit: No orders of the defined types were placed in this encounter.     Assessment:     Normal Gyn Exam.    Plan:    Contraception: status post hysterectomy. Mammogram ordered. Follow up in: 3 years.     Return in about 3 years (around 10/11/2022) for yearly, with Dr Elonda Husky.

## 2020-03-21 NOTE — H&P (Signed)
Surgical History & Physical  Patient Name: Martha Solomon DOB: 28-Jul-1956  Surgery: Cataract extraction with intraocular lens implant phacoemulsification; Left Eye  Surgeon: Baruch Goldmann MD Surgery Date:  03/31/2020 Pre-Op Date:  03/13/2020  HPI: A 61 Yr. old female patient is referred by Dr Jorja Loa for cataract eval. 1. 1. The patient complains of difficulty when driving, which began 3 years ago. Both eyes are affected. The episode is gradual. The condition's severity increased since last visit. Symptoms occur when the patient is driving, outside and reading. The complaint is associated with glare. This is negatively affecting the patient's quality of life. HPI was performed by Baruch Goldmann .  Medical History: Dry Eyes Cataracts OD: Vitreal Floaters, OD: PVD Cholesterol High Blood Pressure Seasonal Allergies  Review of Systems Negative Allergic/Immunologic Negative Cardiovascular Negative Constitutional Negative Ear, Nose, Mouth & Throat Negative Endocrine Negative Eyes Negative Gastrointestinal Negative Genitourinary Negative Hemotologic/Lymphatic Negative Integumentary Negative Musculoskeletal Negative Neurological Negative Psychiatry Negative Respiratory  Social   Never smoked   Medication Systane Balance,  Lipitor, Xanax,   Sx/Procedures Gallbladder, Hysterectomy,   Drug Allergies  Pollen, Reglan,   History & Physical: Heent:  Cataract, Left eye NECK: supple without bruits LUNGS: lungs clear to auscultation CV: regular rate and rhythm Abdomen: soft and non-tender  Impression & Plan: Assessment: 1.  COMBINED FORMS AGE RELATED CATARACT; Both Eyes (H25.813) 2.  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  VITREOUS DETACHMENT PVD; Right Eye [phone])  Plan: 1.  Cataract accounts for the patient's decreased vision. This visual impairment is not correctable with a tolerable change in glasses or contact lenses. Cataract surgery with an  implantation of a new lens should significantly improve the visual and functional status of the patient. Discussed all risks, benefits, alternatives, and potential complications. Discussed the procedures and recovery. Patient desires to have surgery. A-scan ordered and performed today for intra-ocular lens calculations. The surgery will be performed in order to improve vision for driving, reading, and for eye examinations. Recommend phacoemulsification with intra-ocular lens. Recommend Dextenza for post-operative pain and inflammation. Left Eye worse - first. Dilates well - shugarcaine by protocol. 2.  vs. old injury. Call with any worsening vision, pain, or any other concerns. 3.  Old Asymptomatic. RD precautions given. Patient to call with increase in flashing lights/floaters/dark curtain.

## 2020-03-24 NOTE — Patient Instructions (Signed)
Your procedure is scheduled on:   03/31/2020              Report to [facility] at 8:00    AM.                Call this number if you have problems the morning of surgery: [phone]   Do not eat or drink :After Midnight.    Take these medicines the morning of surgery with A SIP OF WATER:  flonase if needed          Do not wear jewelry, make-up or nail polish.  Do not wear lotions, powders, or perfumes. You may wear deodorant.  Do not bring valuables to the hospital.  Contacts, dentures or bridgework may not be worn into surgery.  Patients discharged the day of surgery will not be allowed to drive home.  Name and phone number of your driver.                                                                                                                                       Cataract Surgery  A cataract is a clouding of the lens of the eye. When a lens becomes cloudy, vision is reduced based on the degree and nature of the clouding. Surgery may be needed to improve vision. Surgery removes the cloudy lens and usually replaces it with a substitute lens (intraocular lens, IOL). LET YOUR EYE DOCTOR KNOW ABOUT:  Allergies to food or medicine.   Medicines taken including herbs, eyedrops, over-the-counter medicines, and creams.   Use of steroids (by mouth or creams).   Previous problems with anesthetics or numbing medicine.   History of bleeding problems or blood clots.   Previous surgery.   Other health problems, including diabetes and kidney problems.   Possibility of pregnancy, if this applies.  RISKS AND COMPLICATIONS  Infection.   Inflammation of the eyeball (endophthalmitis) that can spread to both eyes (sympathetic ophthalmia).   Poor wound healing.   If an IOL is inserted, it can later fall out of proper position. This is very uncommon.   Clouding of the part of your eye that holds an IOL in place. This is called an "after-cataract." These are uncommon, but easily treated.    BEFORE THE PROCEDURE  Do not eat or drink anything except small amounts of water for 8 to 12 before your surgery, or as directed by your caregiver.    Unless you are told otherwise, continue any eyedrops you have been prescribed.   Talk to your primary caregiver about all other medicines that you take (both prescription and non-prescription). In some cases, you may need to stop or change medicines near the time of your surgery. This is most important if you are taking blood-thinning medicine. Do not stop medicines unless you are told to do so.   Arrange for someone to drive you to and from  the procedure.   Do not put contact lenses in either eye on the day of your surgery.  PROCEDURE There is more than one method for safely removing a cataract. Your doctor can explain the differences and help determine which is best for you. Phacoemulsification surgery is the most common form of cataract surgery.  An injection is given behind the eye or eyedrops are given to make this a painless procedure.   A small cut (incision) is made on the edge of the clear, dome-shaped surface that covers the front of the eye (cornea).   A tiny probe is painlessly inserted into the eye. This device gives off ultrasound waves that soften and break up the cloudy center of the lens. This makes it easier for the cloudy lens to be removed by suction.   An IOL may be implanted.   The normal lens of the eye is covered by a clear capsule. Part of that capsule is intentionally left in the eye to support the IOL.   Your surgeon may or may not use stitches to close the incision.  There are other forms of cataract surgery that require a larger incision and stiches to close the eye. This approach is taken in cases where the doctor feels that the cataract cannot be easily removed using phacoemulsification. AFTER THE PROCEDURE  When an IOL is implanted, it does not need care. It becomes a permanent part of your eye and cannot  be seen or felt.   Your doctor will schedule follow-up exams to check on your progress.   Review your other medicines with your doctor to see which can be resumed after surgery.   Use eyedrops or take medicine as prescribed by your doctor.  Document Released: 07/04/2011 Document Reviewed: 07/01/2011 [facility] Patient Information 2012 [place].  .Cataract Surgery Care After Refer to this sheet in the next few weeks. These instructions provide you with information on caring for yourself after your procedure. Your caregiver may also give you more specific instructions. Your treatment has been planned according to current medical practices, but problems sometimes occur. Call your caregiver if you have any problems or questions after your procedure.  HOME CARE INSTRUCTIONS   Avoid strenuous activities as directed by your caregiver.   Ask your caregiver when you can resume driving.   Use eyedrops or other medicines to help healing and control pressure inside your eye as directed by your caregiver.   Only take over-the-counter or prescription medicines for pain, discomfort, or fever as directed by your caregiver.   Do not to touch or rub your eyes.   You may be instructed to use a protective shield during the first few days and nights after surgery. If not, wear sunglasses to protect your eyes. This is to protect the eye from pressure or from being accidentally bumped.   Keep the area around your eye clean and dry. Avoid swimming or allowing water to hit you directly in the face while showering. Keep soap and shampoo out of your eyes.   Do not bend or lift heavy objects. Bending increases pressure in the eye. You can walk, climb stairs, and do light household chores.   Do not put a contact lens into the eye that had surgery until your caregiver says it is okay to do so.   Ask your doctor when you can return to work. This will depend on the kind of work that you do. If you work in a  dusty environment, you may  be advised to wear protective eyewear for a period of time.   Ask your caregiver when it will be safe to engage in sexual activity.   Continue with your regular eye exams as directed by your caregiver.  What to expect:  It is normal to feel itching and mild discomfort for a few days after cataract surgery. Some fluid discharge is also common, and your eye may be sensitive to light and touch.   After 1 to 2 days, even moderate discomfort should disappear. In most cases, healing will take about 6 weeks.   If you received an intraocular lens (IOL), you may notice that colors are very bright or have a blue tinge. Also, if you have been in bright sunlight, everything may appear reddish for a few hours. If you see these color tinges, it is because your lens is clear and no longer cloudy. Within a few months after receiving an IOL, these extra colors should go away. When you have healed, you will probably need new glasses.  SEEK MEDICAL CARE IF:   You have increased bruising around your eye.   You have discomfort not helped by medicine.  SEEK IMMEDIATE MEDICAL CARE IF:   You have a  fever.   You have a worsening or sudden vision loss.   You have redness, swelling, or increasing pain in the eye.   You have a thick discharge from the eye that had surgery.  MAKE SURE YOU:  Understand these instructions.   Will watch your condition.   Will get help right away if you are not doing well or get worse.  Document Released: 02/01/2005 Document Revised: 07/04/2011 Document Reviewed: 03/08/2011 [facility] Patient Information 2012 [place].    Monitored Anesthesia Care  Monitored anesthesia care is an anesthesia service for a medical procedure. Anesthesia is the loss of the ability to feel pain. It is produced by medications called anesthetics. It may affect a small area of your body (local anesthesia), a large area of your body (regional anesthesia), or your entire  body (general anesthesia). The need for monitored anesthesia care depends your procedure, your condition, and the potential need for regional or general anesthesia. It is often provided during procedures where:   General anesthesia may be needed if there are complications. This is because you need special care when you are under general anesthesia.    You will be under local or regional anesthesia. This is so that you are able to have higher levels of anesthesia if needed.    You will receive calming medications (sedatives). This is especially the case if sedatives are given to put you in a semi-conscious state of relaxation (deep sedation). This is because the amount of sedative needed to produce this state can be hard to predict. Too much of a sedative can produce general anesthesia. Monitored anesthesia care is performed by one or more caregivers who have special training in all types of anesthesia. You will need to meet with these caregivers before your procedure. During this meeting, they will ask you about your medical history. They will also give you instructions to follow. (For example, you will need to stop eating and drinking before your procedure. You may also need to stop or change medications you are taking.) During your procedure, your caregivers will stay with you. They will:   Watch your condition. This includes watching you blood pressure, breathing, and level of pain.    Diagnose and treat problems that occur.    Give  medications if they are needed. These may include calming medications (sedatives) and anesthetics.    Make sure you are comfortable.   Having monitored anesthesia care does not necessarily mean that you will be under anesthesia. It does mean that your caregivers will be able to manage anesthesia if you need it or if it occurs. It also means that you will be able to have a different type of anesthesia than you are having if you need it. When your procedure is complete,  your caregivers will continue to watch your condition. They will make sure any medications wear off before you are allowed to go home.  Document Released: 04/10/2005 Document Revised: 11/09/2012 Document Reviewed: 08/26/2012 [facility] Patient Information 2014 [place], [place].

## 2020-03-29 ENCOUNTER — Other Ambulatory Visit: Payer: Self-pay

## 2020-03-29 ENCOUNTER — Encounter (HOSPITAL_COMMUNITY)
Admission: RE | Admit: 2020-03-29 | Discharge: 2020-03-29 | Disposition: A | Discharge status: Home / Self Care | Payer: 59 | Source: Ambulatory Visit | Attending: Ophthalmology | Admitting: Ophthalmology

## 2020-03-29 ENCOUNTER — Other Ambulatory Visit (HOSPITAL_COMMUNITY)
Admission: RE | Admit: 2020-03-29 | Discharge: 2020-03-29 | Disposition: A | Discharge status: Home / Self Care | Payer: 59 | Source: Ambulatory Visit | Attending: Ophthalmology | Admitting: Ophthalmology

## 2020-03-29 ENCOUNTER — Encounter (HOSPITAL_COMMUNITY): Payer: Self-pay

## 2020-03-29 DIAGNOSIS — Z20822 Contact with and (suspected) exposure to covid-19: Secondary | ICD-10-CM | POA: Diagnosis not present

## 2020-03-29 DIAGNOSIS — I10 Essential (primary) hypertension: Secondary | ICD-10-CM | POA: Diagnosis not present

## 2020-03-29 DIAGNOSIS — Z01818 Encounter for other preprocedural examination: Secondary | ICD-10-CM | POA: Diagnosis present

## 2020-03-29 LAB — BASIC METABOLIC PANEL
Anion gap: 12 (ref 5–15)
BUN: 14 mg/dL (ref 8–23)
CO2: 24 mmol/L (ref 22–32)
Calcium: 10.1 mg/dL (ref 8.9–10.3)
Chloride: 102 mmol/L (ref 98–111)
Creatinine, Ser: 0.96 mg/dL (ref 0.44–1.00)
GFR calc Af Amer: >60 mL/min (ref >60)
GFR calc non Af Amer: >60 mL/min (ref >60)
Glucose, Bld: 109 mg/dL — ABNORMAL HIGH (ref 70–99)
Potassium: 4.1 mmol/L (ref 3.5–5.1)
Sodium: 138 mmol/L (ref 135–145)

## 2020-03-29 LAB — SARS CORONAVIRUS 2 (TAT 6-24 HRS): SARS Coronavirus 2: NEGATIVE

## 2020-03-31 ENCOUNTER — Encounter (HOSPITAL_COMMUNITY)
Admission: RE | Disposition: A | Discharge status: Home / Self Care | Payer: Self-pay | Source: Home / Self Care | Attending: Ophthalmology

## 2020-03-31 ENCOUNTER — Ambulatory Visit (HOSPITAL_COMMUNITY): Payer: 59 | Admitting: Anesthesiology

## 2020-03-31 ENCOUNTER — Ambulatory Visit (HOSPITAL_COMMUNITY)
Admission: RE | Admit: 2020-03-31 | Discharge: 2020-03-31 | Disposition: A | Discharge status: Home / Self Care | Payer: 59 | Attending: Ophthalmology | Admitting: Ophthalmology

## 2020-03-31 ENCOUNTER — Encounter (HOSPITAL_COMMUNITY): Payer: Self-pay | Admitting: Ophthalmology

## 2020-03-31 DIAGNOSIS — H43811 Vitreous degeneration, right eye: Secondary | ICD-10-CM | POA: Insufficient documentation

## 2020-03-31 DIAGNOSIS — H18451 Nodular corneal degeneration, right eye: Secondary | ICD-10-CM | POA: Diagnosis not present

## 2020-03-31 DIAGNOSIS — R7303 Prediabetes: Secondary | ICD-10-CM | POA: Diagnosis not present

## 2020-03-31 DIAGNOSIS — E7800 Pure hypercholesterolemia, unspecified: Secondary | ICD-10-CM | POA: Diagnosis not present

## 2020-03-31 DIAGNOSIS — Z79899 Other long term (current) drug therapy: Secondary | ICD-10-CM | POA: Diagnosis not present

## 2020-03-31 DIAGNOSIS — H25812 Combined forms of age-related cataract, left eye: Secondary | ICD-10-CM | POA: Diagnosis not present

## 2020-03-31 DIAGNOSIS — I10 Essential (primary) hypertension: Secondary | ICD-10-CM | POA: Insufficient documentation

## 2020-03-31 HISTORY — PX: CATARACT EXTRACTION W/PHACO: SHX586

## 2020-03-31 SURGERY — PHACOEMULSIFICATION, CATARACT, WITH IOL INSERTION
Anesthesia: Monitor Anesthesia Care | Site: Eye | Laterality: Left

## 2020-03-31 MED ORDER — PROVISC 10 MG/ML IO SOLN
INTRAOCULAR | Status: DC | PRN
  Administered 2020-03-31: 0.85 mL via INTRAOCULAR

## 2020-03-31 MED ORDER — POVIDONE-IODINE 5 % OP SOLN
OPHTHALMIC | Status: DC | PRN
  Administered 2020-03-31: 1 via OPHTHALMIC

## 2020-03-31 MED ORDER — FENTANYL CITRATE (PF) 100 MCG/2ML IJ SOLN
INTRAMUSCULAR | Status: DC | PRN
Start: 2020-03-31 — End: 2020-03-31
  Administered 2020-03-31: 100 ug via INTRAVENOUS

## 2020-03-31 MED ORDER — PHENYLEPHRINE HCL 2.5 % OP SOLN
1.0000 [drp] | OPHTHALMIC | Status: AC | PRN
  Administered 2020-03-31 (×3): 1 [drp] via OPHTHALMIC

## 2020-03-31 MED ORDER — MIDAZOLAM HCL 2 MG/2ML IJ SOLN
INTRAMUSCULAR | Status: AC
  Filled 2020-03-31: qty 2

## 2020-03-31 MED ORDER — EPINEPHRINE PF 1 MG/ML IJ SOLN
INTRAMUSCULAR | Status: AC
  Filled 2020-03-31: qty 2

## 2020-03-31 MED ORDER — MIDAZOLAM HCL 2 MG/2ML IJ SOLN
INTRAMUSCULAR | Status: DC | PRN
  Administered 2020-03-31: 2 mg via INTRAVENOUS

## 2020-03-31 MED ORDER — FENTANYL CITRATE (PF) 100 MCG/2ML IJ SOLN
INTRAMUSCULAR | Status: AC
  Filled 2020-03-31: qty 2

## 2020-03-31 MED ORDER — BSS IO SOLN
INTRAOCULAR | Status: DC | PRN
  Administered 2020-03-31: 15 mL via INTRAOCULAR

## 2020-03-31 MED ORDER — LIDOCAINE HCL (PF) 1 % IJ SOLN
INTRAOCULAR | Status: DC | PRN
  Administered 2020-03-31: 1 mL via OPHTHALMIC

## 2020-03-31 MED ORDER — LACTATED RINGERS IV SOLN: INTRAVENOUS | Status: DC

## 2020-03-31 MED ORDER — CYCLOPENTOLATE-PHENYLEPHRINE 0.2-1 % OP SOLN
1.0000 [drp] | OPHTHALMIC | Status: AC | PRN
  Administered 2020-03-31 (×3): 1 [drp] via OPHTHALMIC

## 2020-03-31 MED ORDER — SODIUM HYALURONATE 23 MG/ML IO SOLN
INTRAOCULAR | Status: DC | PRN
  Administered 2020-03-31: 0.6 mL via INTRAOCULAR

## 2020-03-31 MED ORDER — TETRACAINE HCL 0.5 % OP SOLN
1.0000 [drp] | OPHTHALMIC | Status: AC | PRN
  Administered 2020-03-31 (×3): 1 [drp] via OPHTHALMIC

## 2020-03-31 MED ORDER — LIDOCAINE HCL 3.5 % OP GEL
1.0000 "application " | Freq: Once | OPHTHALMIC | Status: AC
  Administered 2020-03-31: 1 via OPHTHALMIC

## 2020-03-31 MED ORDER — EPINEPHRINE PF 1 MG/ML IJ SOLN
INTRAOCULAR | Status: DC | PRN
  Administered 2020-03-31: 500 mL

## 2020-03-31 MED ORDER — NEOMYCIN-POLYMYXIN-DEXAMETH 3.5-10000-0.1 OP SUSP
OPHTHALMIC | Status: DC | PRN
  Administered 2020-03-31: 1 [drp] via OPHTHALMIC

## 2020-03-31 SURGICAL SUPPLY — 13 items
CLOTH BEACON ORANGE TIMEOUT ST (SAFETY) ×1 IMPLANT
EYE SHIELD UNIVERSAL CLEAR (GAUZE/BANDAGES/DRESSINGS) ×1 IMPLANT
GLOVE BIOGEL PI IND STRL 7.0 (GLOVE) IMPLANT
GLOVE BIOGEL PI INDICATOR 7.0 (GLOVE) ×2
LENS ALC ACRYL/TECN (Ophthalmic Related) ×1 IMPLANT
NDL HYPO 18GX1.5 BLUNT FILL (NEEDLE) IMPLANT
NEEDLE HYPO 18GX1.5 BLUNT FILL (NEEDLE) ×2 IMPLANT
PAD ARMBOARD 7.5X6 YLW CONV (MISCELLANEOUS) ×1 IMPLANT
SYR TB 1ML LL NO SAFETY (SYRINGE) ×1 IMPLANT
TAPE SURG TRANSPORE 1 IN (GAUZE/BANDAGES/DRESSINGS) IMPLANT
TAPE SURGICAL TRANSPORE 1 IN (GAUZE/BANDAGES/DRESSINGS) ×2
VISCOELASTIC ADDITIONAL (OPHTHALMIC RELATED) ×1 IMPLANT
WATER STERILE IRR 250ML POUR (IV SOLUTION) ×1 IMPLANT

## 2020-03-31 NOTE — Op Note (Signed)
Date of procedure: 03/31/20  Pre-operative diagnosis: Visually significant age-related combined cataract, Left Eye (H25.812)  Post-operative diagnosis: Visually significant age-related combined cataract, Left Eye (H25.812)  Procedure: Removal of cataract via phacoemulsification and insertion of intra-ocular lens Johnson and [place]  +23.0D into the capsular bag of the Left Eye  Attending surgeon: Gerda Diss. Yohannes Waibel, MD, MA  Anesthesia: MAC, Topical Akten  Complications: None  Estimated Blood Loss: <19m (minimal)  Specimens: None  Implants: As above  Indications:  Visually significant age-related cataract, Left Eye  Procedure:  The patient was seen and identified in the pre-operative area. The operative eye was identified and dilated.  The operative eye was marked.  Topical anesthesia was administered to the operative eye.     The patient was then to the operative suite and placed in the supine position.  A timeout was performed confirming the patient, procedure to be performed, and all other relevant information.   The patient's face was prepped and draped in the usual fashion for intra-ocular surgery.  A lid speculum was placed into the operative eye and the surgical microscope moved into place and focused.  An inferotemporal paracentesis was created using a 20 gauge paracentesis blade.  Shugarcaine was injected into the anterior chamber.  Viscoelastic was injected into the anterior chamber.  A temporal clear-corneal main wound incision was created using a 2.422mmicrokeratome.  A continuous curvilinear capsulorrhexis was initiated using an irrigating cystitome and completed using capsulorrhexis forceps.  Hydrodissection and hydrodeliniation were performed.  Viscoelastic was injected into the anterior chamber.  A phacoemulsification handpiece and a chopper as a second instrument were used to remove the nucleus and epinucleus. The irrigation/aspiration handpiece was used to remove  any remaining cortical material.   The capsular bag was reinflated with viscoelastic, checked, and found to be intact.  The intraocular lens was inserted into the capsular bag.  The irrigation/aspiration handpiece was used to remove any remaining viscoelastic.  The clear corneal wound and paracentesis wounds were then hydrated and checked with Weck-Cels to be watertight.  The lid-speculum was removed.  The drape was removed.  The patient's face was cleaned with a wet and dry 4x4.   Maxitrol was instilled in the eye. A clear shield was taped over the eye. The patient was taken to the post-operative care unit in good condition, having tolerated the procedure well.  Post-Op Instructions: [facility] and will receive all other orders and instructions.

## 2020-03-31 NOTE — Anesthesia Postprocedure Evaluation (Signed)
Anesthesia Post Note  Patient: Martha Solomon  Procedure(s) Performed: CATARACT EXTRACTION PHACO AND INTRAOCULAR LENS PLACEMENT (IOC) (Left Eye)  Patient location during evaluation: Phase II Anesthesia Type: MAC Level of consciousness: awake, oriented, awake and alert and patient cooperative Pain management: satisfactory to patient Vital Signs Assessment: post-procedure vital signs reviewed and stable Respiratory status: spontaneous breathing, respiratory function stable and nonlabored ventilation Cardiovascular status: stable Postop Assessment: no apparent nausea or vomiting Anesthetic complications: no   No complications documented.   Last Vitals:  Vitals:   03/31/20 0945 03/31/20 1000  BP: 121/71 124/64  Pulse: 67 70  Resp: (!) 21 20  Temp:    SpO2: 100% 100%    Last Pain:  Vitals:   03/31/20 0944  PainSc: 0-No pain                 Cyanne Delmar

## 2020-03-31 NOTE — Interval H&P Note (Signed)
History and Physical Interval Note:  03/31/2020 10:06 AM  Martha Solomon  has presented today for surgery, with the diagnosis of Nuclear sclerotic cataract - Left eye.  The various methods of treatment have been discussed with the patient and family. After consideration of risks, benefits and other options for treatment, the patient has consented to  Procedure(s) with comments: CATARACT EXTRACTION PHACO AND INTRAOCULAR LENS PLACEMENT (IOC) (Left) - CDE: as a surgical intervention.  The patient's history has been reviewed, patient examined, no change in status, stable for surgery.  I have reviewed the patient's chart and labs.  Questions were answered to the patient's satisfaction.     Baruch Goldmann

## 2020-03-31 NOTE — Anesthesia Preprocedure Evaluation (Signed)
Anesthesia Evaluation  Patient identified by MRN, date of birth, ID band Patient awake    Reviewed: Allergy & Precautions, NPO status , Patient's Chart, lab work & pertinent test results  History of Anesthesia Complications Negative for: history of anesthetic complications  Airway Mallampati: II  TM Distance: >3 FB Neck ROM: Full    Dental   Crowns :   Pulmonary neg pulmonary ROS,    Pulmonary exam normal breath sounds clear to auscultation       Cardiovascular Exercise Tolerance: Good hypertension, Pt. on medications Normal cardiovascular exam Rhythm:Regular Rate:Normal     Neuro/Psych negative neurological ROS  negative psych ROS   GI/Hepatic negative GI ROS, Neg liver ROS,   Endo/Other  diabetes (pre), Well Controlled, Type 2  Renal/GU      Musculoskeletal negative musculoskeletal ROS (+)   Abdominal   Peds  Hematology negative hematology ROS (+)   Anesthesia Other Findings   Reproductive/Obstetrics negative OB ROS                             Anesthesia Physical Anesthesia Plan  ASA: II  Anesthesia Plan: MAC   Post-op Pain Management:    Induction:   PONV Risk Score and Plan:   Airway Management Planned: Nasal Cannula and Natural Airway  Additional Equipment:   Intra-op Plan:   Post-operative Plan:   Informed Consent: I have reviewed the patients History and Physical, chart, labs and discussed the procedure including the risks, benefits and alternatives for the proposed anesthesia with the patient or authorized representative who has indicated his/her understanding and acceptance.     Dental advisory given  Plan Discussed with: CRNA and Surgeon  Anesthesia Plan Comments:         Anesthesia Quick Evaluation

## 2020-03-31 NOTE — Transfer of Care (Signed)
Immediate Anesthesia Transfer of Care Note  Patient: Martha Solomon  Procedure(s) Performed: CATARACT EXTRACTION PHACO AND INTRAOCULAR LENS PLACEMENT (IOC) (Left Eye)  Patient Location: PACU  Anesthesia Type:MAC  Level of Consciousness: awake, alert , oriented and patient cooperative  Airway & Oxygen Therapy: Patient Spontanous Breathing  Post-op Assessment: Report given to RN, Post -op Vital signs reviewed and stable and Patient moving all extremities X 4  Post vital signs: Reviewed and stable  Last Vitals:  Vitals Value Taken Time  BP    Temp    Pulse    Resp    SpO2      Last Pain:  Vitals:   03/31/20 0944  PainSc: 0-No pain         Complications: No complications documented.

## 2020-03-31 NOTE — Discharge Instructions (Signed)
Please discharge patient when stable, will follow up today with Dr. Peaches Vanoverbeke at the [facility] [place] office immediately following discharge.  Leave shield in place until visit.  All paperwork with discharge instructions will be given at the office.   [facility] Address:  [address]  [place], [place] [address]             Monitored Anesthesia Care, Care After These instructions provide you with information about caring for yourself after your procedure. Your health care provider may also give you more specific instructions. Your treatment has been planned according to current medical practices, but problems sometimes occur. Call your health care provider if you have any problems or questions after your procedure. What can I expect after the procedure? After your procedure, you may:  Feel sleepy for several hours.  Feel clumsy and have poor balance for several hours.  Feel forgetful about what happened after the procedure.  Have poor judgment for several hours.  Feel nauseous or vomit.  Have a sore throat if you had a breathing tube during the procedure. Follow these instructions at home: For at least 24 hours after the procedure:      Have a responsible adult stay with you. It is important to have someone help care for you until you are awake and alert.  Rest as needed.  Do not: ? Participate in activities in which you could fall or become injured. ? Drive. ? Use heavy machinery. ? Drink alcohol. ? Take sleeping pills or medicines that cause drowsiness. ? Make important decisions or sign legal documents. ? Take care of children on your own. Eating and drinking  Follow the diet that is recommended by your health care provider.  If you vomit, drink water, juice, or soup when you can drink without vomiting.  Make sure you have little or no nausea before eating solid foods. General instructions  Take over-the-counter and  prescription medicines only as told by your health care provider.  If you have sleep apnea, surgery and certain medicines can increase your risk for breathing problems. Follow instructions from your health care provider about wearing your sleep device: ? Anytime you are sleeping, including during daytime naps. ? While taking prescription pain medicines, sleeping medicines, or medicines that make you drowsy.  If you smoke, do not smoke without supervision.  Keep all follow-up visits as told by your health care provider. This is important. Contact a health care provider if:  You keep feeling nauseous or you keep vomiting.  You feel light-headed.  You develop a rash.  You have a fever. Get help right away if:  You have trouble breathing. Summary  For several hours after your procedure, you may feel sleepy and have poor judgment.  Have a responsible adult stay with you for at least 24 hours or until you are awake and alert. This information is not intended to replace advice given to you by your health care provider. Make sure you discuss any questions you have with your health care provider. Document Revised: 10/13/2017 Document Reviewed: 11/05/2015 Elsevier Patient Education  2020 [employer].  

## 2020-04-07 NOTE — H&P (Signed)
Surgical History & Physical  Patient Name: Martha Solomon DOB: May 31, 1956  Surgery: Cataract extraction with intraocular lens implant phacoemulsification; Right Eye  Surgeon: Baruch Goldmann MD Surgery Date:  04/14/2020 Pre-Op Date:  04/06/2020  HPI: A 87 Yr. old female patient The patient is returning after cataract surgery. The left eye is affected. Status post cataract surgery, which began 1 days ago: Since the last visit, the affected area feels improvement and is doing well. The patient's vision is improved and stable. The complaint is associated with itching. Patient is following medication instructions. Omni TID OS and Systane OU. Denies any increase in floaters or flashes of light. The patient complains of difficulty when reading fine print, books, newspaper, instructions etc.. The right eye is affected. The episode is constant. Symptoms occur when the patient is reading. Poor vision is negatively effect pt's quality of life The condition is worse outdoors. HPI Completed by Dr. Baruch Goldmann  Medical History: Dry Eyes Cataracts OD: Vitreal Floaters, OD: PVD Cholesterol High Blood Pressure  Review of Systems Negative Allergic/Immunologic Negative Cardiovascular Negative Constitutional Negative Ear, Nose, Mouth & Throat Negative Endocrine Negative Eyes Negative Gastrointestinal Negative Genitourinary Negative Hemotologic/Lymphatic Negative Integumentary Negative Musculoskeletal Negative Neurological Negative Psychiatry Negative Respiratory  Social   Never smoked    Medication Systane Balance, Prednisolone-gatiflox-bromfenac,  Lipitor, Xanax,   Sx/Procedures Phaco c IOL OS,  Gallbladder, Hysterectomy,   Drug Allergies  Pollen, Reglan,   History & Physical: Heent:  Cataract, Right eye NECK: supple without bruits LUNGS: lungs clear to auscultation CV: regular rate and rhythm Abdomen: soft and non-tender  Impression & Plan: Assessment: 1.  CATARACT EXTRACTION  STATUS; Left Eye (Z98.42) 2.  INTRAOCULAR LENS IOL (Z96.1) 3.  COMBINED FORMS AGE RELATED CATARACT; Right Eye (H25.811) 4.  BLEPHARITIS; Right Upper Lid, Right Lower Lid, Left Upper Lid, Left Lower Lid (H01.001, H01.002,H01.004,H01.005) 5.  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  1 week after cataract surgery. Doing well with improved vision and normal eye pressure. Call with any problems or concerns. Continue Gati-Brom-Pred 2x/day for 3 more weeks. 2.  Stable. Doing well since surgery Continue Post-op medications 3.  Cataract accounts for the patient's decreased vision. This visual impairment is not correctable with a tolerable change in glasses or contact lenses. Cataract surgery with an implantation of a new lens should significantly improve the visual and functional status of the patient. Discussed all risks, benefits, alternatives, and potential complications. Discussed the procedures and recovery. Patient desires to have surgery. A-scan ordered and performed today for intra-ocular lens calculations. The surgery will be performed in order to improve vision for driving, reading, and for eye examinations. Recommend phacoemulsification with intra-ocular lens. Recommend Dextenza for post-operative pain and inflammation. Right Eye. Surgery required to correct imbalance of vision. Dilates well - shugarcaine by protocol. 4.  Begin/continue lid scrubs. 5.  Stable. vs. old injury. Call with any worsening vision, pain, or any other concerns.

## 2020-04-11 ENCOUNTER — Encounter (HOSPITAL_COMMUNITY)
Admission: RE | Admit: 2020-04-11 | Discharge: 2020-04-11 | Disposition: A | Discharge status: Home / Self Care | Payer: 59 | Source: Ambulatory Visit | Attending: Ophthalmology | Admitting: Ophthalmology

## 2020-04-11 ENCOUNTER — Other Ambulatory Visit: Payer: Self-pay

## 2020-04-12 ENCOUNTER — Other Ambulatory Visit: Payer: Self-pay

## 2020-04-12 ENCOUNTER — Other Ambulatory Visit (HOSPITAL_COMMUNITY)
Admission: RE | Admit: 2020-04-12 | Discharge: 2020-04-12 | Disposition: A | Discharge status: Home / Self Care | Payer: 59 | Source: Ambulatory Visit | Attending: Ophthalmology | Admitting: Ophthalmology

## 2020-04-12 DIAGNOSIS — Z20822 Contact with and (suspected) exposure to covid-19: Secondary | ICD-10-CM | POA: Insufficient documentation

## 2020-04-12 DIAGNOSIS — Z01812 Encounter for preprocedural laboratory examination: Secondary | ICD-10-CM | POA: Insufficient documentation

## 2020-04-12 LAB — SARS CORONAVIRUS 2 (TAT 6-24 HRS): SARS Coronavirus 2: NEGATIVE

## 2020-04-14 ENCOUNTER — Encounter (HOSPITAL_COMMUNITY): Payer: Self-pay | Admitting: Ophthalmology

## 2020-04-14 ENCOUNTER — Other Ambulatory Visit: Payer: Self-pay

## 2020-04-14 ENCOUNTER — Ambulatory Visit (HOSPITAL_COMMUNITY): Payer: 59 | Admitting: Anesthesiology

## 2020-04-14 ENCOUNTER — Encounter (HOSPITAL_COMMUNITY)
Admission: RE | Disposition: A | Discharge status: Home / Self Care | Payer: Self-pay | Source: Home / Self Care | Attending: Ophthalmology

## 2020-04-14 ENCOUNTER — Ambulatory Visit (HOSPITAL_COMMUNITY)
Admission: RE | Admit: 2020-04-14 | Discharge: 2020-04-14 | Disposition: A | Discharge status: Home / Self Care | Payer: 59 | Attending: Ophthalmology | Admitting: Ophthalmology

## 2020-04-14 DIAGNOSIS — I10 Essential (primary) hypertension: Secondary | ICD-10-CM | POA: Diagnosis not present

## 2020-04-14 DIAGNOSIS — Z961 Presence of intraocular lens: Secondary | ICD-10-CM | POA: Insufficient documentation

## 2020-04-14 DIAGNOSIS — H25811 Combined forms of age-related cataract, right eye: Secondary | ICD-10-CM | POA: Insufficient documentation

## 2020-04-14 DIAGNOSIS — E1136 Type 2 diabetes mellitus with diabetic cataract: Secondary | ICD-10-CM | POA: Diagnosis not present

## 2020-04-14 DIAGNOSIS — H18451 Nodular corneal degeneration, right eye: Secondary | ICD-10-CM | POA: Insufficient documentation

## 2020-04-14 DIAGNOSIS — H01001 Unspecified blepharitis right upper eyelid: Secondary | ICD-10-CM | POA: Diagnosis not present

## 2020-04-14 DIAGNOSIS — Z9842 Cataract extraction status, left eye: Secondary | ICD-10-CM | POA: Diagnosis not present

## 2020-04-14 HISTORY — PX: CATARACT EXTRACTION W/PHACO: SHX586

## 2020-04-14 LAB — GLUCOSE, CAPILLARY: Glucose-Capillary: 95 mg/dL (ref 70–99)

## 2020-04-14 SURGERY — PHACOEMULSIFICATION, CATARACT, WITH IOL INSERTION
Anesthesia: Monitor Anesthesia Care | Site: Eye | Laterality: Right

## 2020-04-14 MED ORDER — CYCLOPENTOLATE-PHENYLEPHRINE 0.2-1 % OP SOLN
1.0000 [drp] | OPHTHALMIC | Status: AC | PRN
  Administered 2020-04-14 (×3): 1 [drp] via OPHTHALMIC

## 2020-04-14 MED ORDER — TETRACAINE HCL 0.5 % OP SOLN
1.0000 [drp] | OPHTHALMIC | Status: AC | PRN
  Administered 2020-04-14 (×3): 1 [drp] via OPHTHALMIC

## 2020-04-14 MED ORDER — PHENYLEPHRINE HCL 2.5 % OP SOLN
1.0000 [drp] | OPHTHALMIC | Status: AC | PRN
  Administered 2020-04-14 (×3): 1 [drp] via OPHTHALMIC

## 2020-04-14 MED ORDER — EPINEPHRINE PF 1 MG/ML IJ SOLN
INTRAOCULAR | Status: DC | PRN
  Administered 2020-04-14: 500 mL

## 2020-04-14 MED ORDER — NEOMYCIN-POLYMYXIN-DEXAMETH 3.5-10000-0.1 OP SUSP
OPHTHALMIC | Status: DC | PRN
  Administered 2020-04-14: 1 [drp] via OPHTHALMIC

## 2020-04-14 MED ORDER — LIDOCAINE HCL (PF) 1 % IJ SOLN
INTRAOCULAR | Status: DC | PRN
  Administered 2020-04-14: 1 mL via OPHTHALMIC

## 2020-04-14 MED ORDER — LACTATED RINGERS IV SOLN: INTRAVENOUS | Status: DC

## 2020-04-14 MED ORDER — FENTANYL CITRATE (PF) 100 MCG/2ML IJ SOLN
INTRAMUSCULAR | Status: AC
  Filled 2020-04-14: qty 2

## 2020-04-14 MED ORDER — EPINEPHRINE PF 1 MG/ML IJ SOLN
INTRAMUSCULAR | Status: AC
  Filled 2020-04-14: qty 2

## 2020-04-14 MED ORDER — MIDAZOLAM HCL 2 MG/2ML IJ SOLN
INTRAMUSCULAR | Status: AC
  Filled 2020-04-14: qty 2

## 2020-04-14 MED ORDER — SODIUM HYALURONATE 23 MG/ML IO SOLN
INTRAOCULAR | Status: DC | PRN
  Administered 2020-04-14: 0.6 mL via INTRAOCULAR

## 2020-04-14 MED ORDER — PROVISC 10 MG/ML IO SOLN
INTRAOCULAR | Status: DC | PRN
  Administered 2020-04-14: 0.85 mL via INTRAOCULAR

## 2020-04-14 MED ORDER — MIDAZOLAM HCL 2 MG/2ML IJ SOLN
INTRAMUSCULAR | Status: DC | PRN
  Administered 2020-04-14: 1 mg via INTRAVENOUS

## 2020-04-14 MED ORDER — LIDOCAINE HCL 3.5 % OP GEL
1.0000 "application " | Freq: Once | OPHTHALMIC | Status: AC
  Administered 2020-04-14: 1 via OPHTHALMIC

## 2020-04-14 MED ORDER — BSS IO SOLN
INTRAOCULAR | Status: DC | PRN
  Administered 2020-04-14: 15 mL via INTRAOCULAR

## 2020-04-14 MED ORDER — POVIDONE-IODINE 5 % OP SOLN
OPHTHALMIC | Status: DC | PRN
  Administered 2020-04-14: 1 via OPHTHALMIC

## 2020-04-14 MED ORDER — FENTANYL CITRATE (PF) 100 MCG/2ML IJ SOLN
INTRAMUSCULAR | Status: DC | PRN
  Administered 2020-04-14: 50 ug via INTRAVENOUS

## 2020-04-14 MED ORDER — ONDANSETRON HCL 4 MG/2ML IJ SOLN
INTRAMUSCULAR | Status: DC | PRN
  Administered 2020-04-14: 4 mg via INTRAVENOUS

## 2020-04-14 SURGICAL SUPPLY — 13 items
CLOTH BEACON ORANGE TIMEOUT ST (SAFETY) ×1 IMPLANT
EYE SHIELD UNIVERSAL CLEAR (GAUZE/BANDAGES/DRESSINGS) ×1 IMPLANT
GLOVE BIOGEL PI IND STRL 7.0 (GLOVE) IMPLANT
GLOVE BIOGEL PI INDICATOR 7.0 (GLOVE) ×2
LENS ALC ACRYL/TECN (Ophthalmic Related) ×1 IMPLANT
NDL HYPO 18GX1.5 BLUNT FILL (NEEDLE) IMPLANT
NEEDLE HYPO 18GX1.5 BLUNT FILL (NEEDLE) ×2 IMPLANT
PAD ARMBOARD 7.5X6 YLW CONV (MISCELLANEOUS) ×1 IMPLANT
SYR TB 1ML LL NO SAFETY (SYRINGE) ×1 IMPLANT
TAPE SURG TRANSPORE 1 IN (GAUZE/BANDAGES/DRESSINGS) IMPLANT
TAPE SURGICAL TRANSPORE 1 IN (GAUZE/BANDAGES/DRESSINGS) ×2
VISCOELASTIC ADDITIONAL (OPHTHALMIC RELATED) ×1 IMPLANT
WATER STERILE IRR 250ML POUR (IV SOLUTION) ×1 IMPLANT

## 2020-04-14 NOTE — Anesthesia Procedure Notes (Signed)
Procedure Name: MAC Date/Time: 04/14/2020 9:37 AM Performed by: Orlie Dakin, CRNA Pre-anesthesia Checklist: Patient identified, Emergency Drugs available, Suction available and Patient being monitored Patient Re-evaluated:Patient Re-evaluated prior to induction Oxygen Delivery Method: Nasal cannula Placement Confirmation: positive ETCO2

## 2020-04-14 NOTE — Transfer of Care (Signed)
Immediate Anesthesia Transfer of Care Note  Patient: [name]  Procedure(s) Performed: CATARACT EXTRACTION PHACO AND INTRAOCULAR LENS PLACEMENT RIGHT EYE (Right Eye)  Patient Location: Short Stay  Anesthesia Type:MAC  Level of Consciousness: awake, alert , oriented and patient cooperative  Airway & Oxygen Therapy: Patient Spontanous Breathing  Post-op Assessment: Report given to RN and Post -op Vital signs reviewed and stable  Post vital signs: Reviewed and stable  Last Vitals:  Vitals Value Taken Time  BP 122/69 04/14/20 0950  Temp 36.7 C 04/14/20 0950  Pulse 69 04/14/20 0950  Resp 18 04/14/20 0950  SpO2 97 % 04/14/20 0950    Last Pain:  Vitals:   04/14/20 0950  TempSrc: Oral  PainSc: 0-No pain      Patients Stated Pain Goal: 7 (09/20/34 1224)  Complications: No complications documented.

## 2020-04-14 NOTE — Discharge Instructions (Signed)
Please discharge patient when stable, will follow up today with Dr. Dawnmarie Breon at the [facility] office immediately following discharge.  Leave shield in place until visit.  All paperwork with discharge instructions will be given at the office.  [facility] Address:  [address]  [place], [place] [address]  

## 2020-04-14 NOTE — Op Note (Signed)
Date of procedure: 04/14/20  Pre-operative diagnosis:  Visually significant combined form age-related cataract, Right Eye (H25.811)  Post-operative diagnosis:  Visually significant combined form age-related cataract, Right Eye (H25.811)  Procedure: Removal of cataract via phacoemulsification and insertion of intra-ocular lens [employer] [id number]  +23.0D into the capsular bag of the Right Eye  Attending surgeon: Gerda Diss. Lot Medford, MD, MA  Anesthesia: MAC, Topical [place]  Complications: None  Estimated Blood Loss: <15m (minimal)  Specimens: None  Implants: As above  Indications:  Visually significant age-related cataract, Right Eye  Procedure:  The patient was seen and identified in the pre-operative area. The operative eye was identified and dilated.  The operative eye was marked.  Topical anesthesia was administered to the operative eye.     The patient was then to the operative suite and placed in the supine position.  A timeout was performed confirming the patient, procedure to be performed, and all other relevant information.   The patient's face was prepped and draped in the usual fashion for intra-ocular surgery.  A lid speculum was placed into the operative eye and the surgical microscope moved into place and focused.  A superotemporal paracentesis was created using a 20 gauge paracentesis blade.  Shugarcaine was injected into the anterior chamber.  Viscoelastic was injected into the anterior chamber.  A temporal clear-corneal main wound incision was created using a 2.473mmicrokeratome.  A continuous curvilinear capsulorrhexis was initiated using an irrigating cystitome and completed using capsulorrhexis forceps.  Hydrodissection and hydrodeliniation were performed.  Viscoelastic was injected into the anterior chamber.  A phacoemulsification handpiece and a chopper as a second instrument were used to remove the nucleus and epinucleus. The irrigation/aspiration handpiece was  used to remove any remaining cortical material.   The capsular bag was reinflated with viscoelastic, checked, and found to be intact.  The intraocular lens was inserted into the capsular bag.  The irrigation/aspiration handpiece was used to remove any remaining viscoelastic.  The clear corneal wound and paracentesis wounds were then hydrated and checked with Weck-Cels to be watertight.  The lid-speculum was removed.  The drape was removed.  The patient's face was cleaned with a wet and dry 4x4.   Maxitrol was instilled in the eye. A clear shield was taped over the eye. The patient was taken to the post-operative care unit in good condition, having tolerated the procedure well.  Post-Op Instructions: [facility] and will receive all other orders and instructions.

## 2020-04-14 NOTE — Anesthesia Preprocedure Evaluation (Addendum)
Anesthesia Evaluation  Patient identified by MRN, date of birth, ID band Patient awake    Reviewed: Allergy & Precautions, NPO status , Patient's Chart, lab work & pertinent test results  History of Anesthesia Complications Negative for: history of anesthetic complications  Airway Mallampati: II  TM Distance: >3 FB Neck ROM: Full    Dental   Crowns :   Pulmonary neg pulmonary ROS,    Pulmonary exam normal breath sounds clear to auscultation       Cardiovascular Exercise Tolerance: Good hypertension, Pt. on medications Normal cardiovascular exam Rhythm:Regular Rate:Normal     Neuro/Psych negative neurological ROS  negative psych ROS   GI/Hepatic negative GI ROS, Neg liver ROS,   Endo/Other  diabetes, Well Controlled, Type 2  Renal/GU      Musculoskeletal negative musculoskeletal ROS (+)   Abdominal   Peds  Hematology negative hematology ROS (+)   Anesthesia Other Findings   Reproductive/Obstetrics negative OB ROS                             Anesthesia Physical  Anesthesia Plan  ASA: II  Anesthesia Plan: MAC   Post-op Pain Management:    Induction:   PONV Risk Score and Plan:   Airway Management Planned: Nasal Cannula and Natural Airway  Additional Equipment:   Intra-op Plan:   Post-operative Plan:   Informed Consent: I have reviewed the patients History and Physical, chart, labs and discussed the procedure including the risks, benefits and alternatives for the proposed anesthesia with the patient or authorized representative who has indicated his/her understanding and acceptance.     Dental advisory given  Plan Discussed with: CRNA and Surgeon  Anesthesia Plan Comments:         Anesthesia Quick Evaluation

## 2020-04-14 NOTE — Anesthesia Postprocedure Evaluation (Signed)
Anesthesia Post Note  Patient: Martha Solomon  Procedure(s) Performed: CATARACT EXTRACTION PHACO AND INTRAOCULAR LENS PLACEMENT RIGHT EYE (Right Eye)  Patient location during evaluation: Short Stay Anesthesia Type: MAC Level of consciousness: awake and alert Pain management: pain level controlled Vital Signs Assessment: post-procedure vital signs reviewed and stable Respiratory status: spontaneous breathing Cardiovascular status: stable Postop Assessment: no apparent nausea or vomiting Anesthetic complications: no   No complications documented.   Last Vitals:  Vitals:   04/14/20 0910 04/14/20 0950  BP:  122/69  Pulse:  69  Resp: 19 18  Temp:  36.7 C  SpO2:  97%    Last Pain:  Vitals:   04/14/20 0950  TempSrc: Oral  PainSc: 0-No pain                 Everette Rank

## 2020-04-14 NOTE — Interval H&P Note (Signed)
History and Physical Interval Note:  04/14/2020 9:23 AM  Martha Solomon  has presented today for surgery, with the diagnosis of Nuclear sclerotic cataract - Right eye.  The various methods of treatment have been discussed with the patient and family. After consideration of risks, benefits and other options for treatment, the patient has consented to  Procedure(s) with comments: CATARACT EXTRACTION PHACO AND INTRAOCULAR LENS PLACEMENT (IOC) (Right) - CDE:  as a surgical intervention.  The patient's history has been reviewed, patient examined, no change in status, stable for surgery.  I have reviewed the patient's chart and labs.  Questions were answered to the patient's satisfaction.     Baruch Goldmann

## 2020-04-17 ENCOUNTER — Encounter (HOSPITAL_COMMUNITY): Payer: Self-pay | Admitting: Ophthalmology

## 2020-10-09 ENCOUNTER — Other Ambulatory Visit (HOSPITAL_COMMUNITY): Payer: Self-pay | Admitting: Obstetrics & Gynecology

## 2020-10-09 DIAGNOSIS — Z1231 Encounter for screening mammogram for malignant neoplasm of breast: Secondary | ICD-10-CM

## 2020-10-12 ENCOUNTER — Ambulatory Visit (HOSPITAL_COMMUNITY)
Admission: RE | Admit: 2020-10-12 | Discharge: 2020-10-12 | Disposition: A | Discharge status: Home / Self Care | Payer: Medicare PPO | Source: Ambulatory Visit | Attending: Obstetrics & Gynecology | Admitting: Obstetrics & Gynecology

## 2020-10-12 DIAGNOSIS — Z1231 Encounter for screening mammogram for malignant neoplasm of breast: Secondary | ICD-10-CM

## 2020-11-01 ENCOUNTER — Other Ambulatory Visit: Payer: Self-pay | Admitting: Obstetrics & Gynecology

## 2021-03-07 DIAGNOSIS — Z961 Presence of intraocular lens: Secondary | ICD-10-CM | POA: Diagnosis not present

## 2021-03-07 DIAGNOSIS — H18221 Idiopathic corneal edema, right eye: Secondary | ICD-10-CM | POA: Diagnosis not present

## 2021-03-07 DIAGNOSIS — H40041 Steroid responder, right eye: Secondary | ICD-10-CM | POA: Diagnosis not present

## 2021-04-09 DIAGNOSIS — H18421 Band keratopathy, right eye: Secondary | ICD-10-CM | POA: Diagnosis not present

## 2021-04-09 DIAGNOSIS — Z961 Presence of intraocular lens: Secondary | ICD-10-CM | POA: Diagnosis not present

## 2021-04-19 DIAGNOSIS — H18421 Band keratopathy, right eye: Secondary | ICD-10-CM | POA: Diagnosis not present

## 2021-04-25 DIAGNOSIS — Z48810 Encounter for surgical aftercare following surgery on the sense organs: Secondary | ICD-10-CM | POA: Diagnosis not present

## 2021-04-25 DIAGNOSIS — Z961 Presence of intraocular lens: Secondary | ICD-10-CM | POA: Diagnosis not present

## 2021-04-25 DIAGNOSIS — H18421 Band keratopathy, right eye: Secondary | ICD-10-CM | POA: Diagnosis not present

## 2021-04-26 DIAGNOSIS — Z23 Encounter for immunization: Secondary | ICD-10-CM | POA: Diagnosis not present

## 2021-05-02 DIAGNOSIS — Z23 Encounter for immunization: Secondary | ICD-10-CM | POA: Diagnosis not present

## 2021-05-14 DIAGNOSIS — Z961 Presence of intraocular lens: Secondary | ICD-10-CM | POA: Diagnosis not present

## 2021-05-14 DIAGNOSIS — H40041 Steroid responder, right eye: Secondary | ICD-10-CM | POA: Diagnosis not present

## 2021-05-14 DIAGNOSIS — H17821 Peripheral opacity of cornea, right eye: Secondary | ICD-10-CM | POA: Diagnosis not present

## 2021-06-18 DIAGNOSIS — H52221 Regular astigmatism, right eye: Secondary | ICD-10-CM | POA: Diagnosis not present

## 2021-06-18 DIAGNOSIS — H04123 Dry eye syndrome of bilateral lacrimal glands: Secondary | ICD-10-CM | POA: Diagnosis not present

## 2021-06-18 DIAGNOSIS — H40041 Steroid responder, right eye: Secondary | ICD-10-CM | POA: Diagnosis not present

## 2021-06-18 DIAGNOSIS — H17821 Peripheral opacity of cornea, right eye: Secondary | ICD-10-CM | POA: Diagnosis not present

## 2021-08-23 DIAGNOSIS — I10 Essential (primary) hypertension: Secondary | ICD-10-CM | POA: Diagnosis not present

## 2021-08-23 DIAGNOSIS — M25559 Pain in unspecified hip: Secondary | ICD-10-CM | POA: Diagnosis not present

## 2021-08-23 DIAGNOSIS — Z6831 Body mass index (BMI) 31.0-31.9, adult: Secondary | ICD-10-CM | POA: Diagnosis not present

## 2021-08-23 DIAGNOSIS — Z1331 Encounter for screening for depression: Secondary | ICD-10-CM | POA: Diagnosis not present

## 2021-08-23 DIAGNOSIS — E669 Obesity, unspecified: Secondary | ICD-10-CM | POA: Diagnosis not present

## 2021-08-27 ENCOUNTER — Encounter: Payer: Self-pay | Admitting: Internal Medicine

## 2021-09-12 ENCOUNTER — Other Ambulatory Visit: Payer: Self-pay

## 2021-09-12 ENCOUNTER — Emergency Department (HOSPITAL_COMMUNITY)
Admission: EM | Admit: 2021-09-12 | Discharge: 2021-09-12 | Disposition: A | Discharge status: Home / Self Care | Payer: Medicare PPO | Attending: Emergency Medicine | Admitting: Emergency Medicine

## 2021-09-12 DIAGNOSIS — S0501XA Injury of conjunctiva and corneal abrasion without foreign body, right eye, initial encounter: Secondary | ICD-10-CM | POA: Insufficient documentation

## 2021-09-12 DIAGNOSIS — X58XXXA Exposure to other specified factors, initial encounter: Secondary | ICD-10-CM | POA: Diagnosis not present

## 2021-09-12 DIAGNOSIS — S0591XA Unspecified injury of right eye and orbit, initial encounter: Secondary | ICD-10-CM | POA: Diagnosis present

## 2021-09-12 MED ORDER — TOBRAMYCIN 0.3 % OP SOLN: 1.0000 [drp] | OPHTHALMIC | 0 refills | Status: AC

## 2021-09-12 MED ORDER — HYDROCODONE-ACETAMINOPHEN 5-325 MG PO TABS: 2.0000 | ORAL_TABLET | Freq: Four times a day (QID) | ORAL | 0 refills | Status: DC | PRN

## 2021-09-12 MED ORDER — HYDROCODONE-ACETAMINOPHEN 5-325 MG PO TABS: 2.0000 | ORAL_TABLET | Freq: Four times a day (QID) | ORAL | 0 refills | Status: AC | PRN

## 2021-09-12 MED ORDER — TOBRAMYCIN 0.3 % OP SOLN: 1.0000 [drp] | OPHTHALMIC | 0 refills | Status: DC

## 2021-09-12 MED ORDER — TETRACAINE HCL 0.5 % OP SOLN
2.0000 [drp] | Freq: Once | OPHTHALMIC | Status: DC
Start: 2021-09-12 — End: 2021-09-12
  Filled 2021-09-12: qty 4

## 2021-09-12 MED ORDER — FLUORESCEIN SODIUM 1 MG OP STRP
1.0000 | ORAL_STRIP | Freq: Once | OPHTHALMIC | Status: DC
  Filled 2021-09-12: qty 1

## 2021-09-12 NOTE — ED Provider Notes (Signed)
[facility] Provider Note   CSN: [id number] Arrival date & time: 09/12/21  1045     History  Chief Complaint  Patient presents with   Eye Problem    Martha Solomon is a 66 y.o. female.  Pt complains of right eye pain and tearing.  Pt reports she has had a lens implant to right eye and has also had surgery to remove calcifications from lens.  Pt called her eye doctor who advised he will see her in am but wanted her to come in to be seen   The history is provided by the patient. No language interpreter was used.  Eye Problem Location:  Right eye Quality:  Aching Severity:  Moderate Onset quality:  Gradual Duration:  1 day Timing:  Constant Progression:  Worsening Chronicity:  New Relieved by:  Nothing Worsened by:  Nothing Ineffective treatments:  None tried Associated symptoms: tearing       Home Medications Prior to Admission medications   Medication Sig Start Date End Date Taking? Authorizing Provider  acetaminophen (TYLENOL) 500 MG tablet Take 1,000 mg by mouth every 6 (six) hours as needed for moderate pain or headache.    [provider]  ASTAXANTHIN PO Take 10 mg by mouth daily.     [provider]  atorvastatin (LIPITOR) 80 MG tablet Take 80 mg by mouth daily.     [provider]  Cholecalciferol (VITAMIN D) 50 MCG (2000 UT) tablet Take 2,000 Units by mouth daily.    [provider]  Coenzyme Q10 (COQ-10) 100 MG CAPS Take 100 mg by mouth daily.    [provider]  estradiol (VIVELLE-DOT) 0.1 MG/24HR patch APPLY ONE PATCH TWICE A WEEK 11/01/20   Florian Buff, MD  fluticasone (FLONASE) 50 MCG/ACT nasal spray Place 1 spray into both nostrils daily as needed for allergies or rhinitis.    [provider]  hydrochlorothiazide (MICROZIDE) 12.5 MG capsule Take 12.5 mg by mouth daily. 02/28/20   [provider]  HYDROcodone-acetaminophen (NORCO/VICODIN) 5-325 MG tablet Take 2 tablets by  mouth every 6 (six) hours as needed for up to 3 days for moderate pain. 09/12/21 09/15/21  Fransico Meadow, PA-C  Krill Oil (MAXIMUM RED KRILL PO) Take 500 mg by mouth daily.    [provider]  losartan (COZAAR) 100 MG tablet Take 100 mg by mouth daily. 02/28/20   [provider]  Polyethyl Glycol-Propyl Glycol (SYSTANE OP) Place 1 drop into both eyes in the morning, at noon, and at bedtime.    [provider]  sodium chloride (OCEAN) 0.65 % SOLN nasal spray Place 1 spray into both nostrils as needed for congestion.    [provider]  tobramycin (TOBREX) 0.3 % ophthalmic solution Place 1 drop into the right eye every 4 (four) hours for 10 days. 09/12/21 09/22/21  Fransico Meadow, PA-C  vitamin C (ASCORBIC ACID) 500 MG tablet Take 500 mg by mouth daily.     [provider]      Allergies    Reglan [metoclopramide]    Review of Systems   Review of Systems  All other systems reviewed and are negative.  Physical Exam Updated Vital Signs BP (!) 143/74 (BP Location: Right Arm)    Pulse 72    Temp 98.6 F (37 C) (Oral)    Resp 17    Ht 5\' 5"  (1.651 m)    Wt 89.8 kg    SpO2 97%  BMI 32.95 kg/m  Physical Exam Vitals reviewed.  Constitutional:      Appearance: Normal appearance.  HENT:     Head: Normocephalic.     Mouth/Throat:     Mouth: Mucous membranes are moist.  Eyes:     General: Lids are normal. Lids are everted, no foreign bodies appreciated. Vision grossly intact.        Right eye: No foreign body.     Intraocular pressure: Right eye pressure is 15 mmHg.     Extraocular Movements: Extraocular movements intact.     Conjunctiva/sclera:     Right eye: Right conjunctiva is injected.     Pupils: Pupils are equal, round, and reactive to light.     Comments: Right eye tearing,  fluro/slitlamp  abrasion corneal,  no foreign body   Cardiovascular:     Rate and Rhythm: Normal rate.  Pulmonary:     Effort: Pulmonary effort is normal.  Skin:     General: Skin is warm.  Neurological:     General: No focal deficit present.     Mental Status: She is alert.  Psychiatric:        Mood and Affect: Mood normal.    ED Results / Procedures / Treatments   Labs (all labs ordered are listed, but only abnormal results are displayed) Labs Reviewed - No data to display  EKG None  Radiology No results found.  Procedures Procedures    Medications Ordered in ED Medications - No data to display  ED Course/ Medical Decision Making/ A&P                           Medical Decision Making Pt complains of right eye pain.  No known injury.  Pt reports pain started when she awoke   Problems Addressed: Abrasion of right cornea, initial encounter: acute illness or injury  Amount and/or Complexity of Data Reviewed Independent Historian: friend External Data Reviewed: notes.    Details: Opthomology notes reviewed.  Risk Prescription drug management. Risk Details: Pt has corneal abrasion.  Pt had immediate relief with tetracaine.            Final Clinical Impression(s) / ED Diagnoses Final diagnoses:  Abrasion of right cornea, initial encounter    Rx / DC Orders ED Discharge Orders          Ordered    HYDROcodone-acetaminophen (NORCO/VICODIN) 5-325 MG tablet  Every 6 hours PRN,   Status:  Discontinued        09/12/21 1603    tobramycin (TOBREX) 0.3 % ophthalmic solution  Every 4 hours,   Status:  Discontinued        09/12/21 1603    HYDROcodone-acetaminophen (NORCO/VICODIN) 5-325 MG tablet  Every 6 hours PRN        09/12/21 1643    tobramycin (TOBREX) 0.3 % ophthalmic solution  Every 4 hours        09/12/21 1643           An After Visit Summary was printed and given to the patient.    Fransico Meadow, Hershal Coria 09/12/21 2350    Lajean Saver, MD 09/16/21 [phone]

## 2021-09-12 NOTE — ED Triage Notes (Signed)
Right eye surgery 03/2021 for calcium removal.  Reports blurred vision.

## 2021-09-12 NOTE — ED Triage Notes (Signed)
Pt had cataract surgery in 03/2020.

## 2021-09-12 NOTE — ED Triage Notes (Signed)
Pt woke up with right eye pain. Denies known trauma. Denies using contacts.

## 2021-09-12 NOTE — Discharge Instructions (Signed)
See your Opthomologist tomorrow morning as scheduled

## 2021-09-13 DIAGNOSIS — H17821 Peripheral opacity of cornea, right eye: Secondary | ICD-10-CM | POA: Diagnosis not present

## 2021-09-13 DIAGNOSIS — H04123 Dry eye syndrome of bilateral lacrimal glands: Secondary | ICD-10-CM | POA: Diagnosis not present

## 2021-09-13 DIAGNOSIS — S0501XA Injury of conjunctiva and corneal abrasion without foreign body, right eye, initial encounter: Secondary | ICD-10-CM | POA: Diagnosis not present

## 2021-09-17 DIAGNOSIS — H17821 Peripheral opacity of cornea, right eye: Secondary | ICD-10-CM | POA: Diagnosis not present

## 2021-09-17 DIAGNOSIS — S0501XA Injury of conjunctiva and corneal abrasion without foreign body, right eye, initial encounter: Secondary | ICD-10-CM | POA: Diagnosis not present

## 2021-09-17 DIAGNOSIS — H04123 Dry eye syndrome of bilateral lacrimal glands: Secondary | ICD-10-CM | POA: Diagnosis not present

## 2021-10-23 ENCOUNTER — Ambulatory Visit: Payer: Medicare PPO

## 2021-11-16 ENCOUNTER — Other Ambulatory Visit (HOSPITAL_COMMUNITY): Payer: Self-pay | Admitting: Obstetrics & Gynecology

## 2021-11-16 DIAGNOSIS — Z1231 Encounter for screening mammogram for malignant neoplasm of breast: Secondary | ICD-10-CM

## 2021-11-19 ENCOUNTER — Other Ambulatory Visit: Payer: Self-pay | Admitting: Obstetrics & Gynecology

## 2021-11-21 ENCOUNTER — Ambulatory Visit (HOSPITAL_COMMUNITY)
Admission: RE | Admit: 2021-11-21 | Discharge: 2021-11-21 | Disposition: A | Discharge status: Home / Self Care | Payer: Medicare PPO | Source: Ambulatory Visit | Attending: Obstetrics & Gynecology | Admitting: Obstetrics & Gynecology

## 2021-11-21 DIAGNOSIS — Z1231 Encounter for screening mammogram for malignant neoplasm of breast: Secondary | ICD-10-CM | POA: Diagnosis not present

## 2021-11-22 ENCOUNTER — Other Ambulatory Visit (HOSPITAL_COMMUNITY): Payer: Self-pay | Admitting: Obstetrics & Gynecology

## 2021-11-22 DIAGNOSIS — R928 Other abnormal and inconclusive findings on diagnostic imaging of breast: Secondary | ICD-10-CM

## 2021-11-22 DIAGNOSIS — R921 Mammographic calcification found on diagnostic imaging of breast: Secondary | ICD-10-CM

## 2021-11-23 ENCOUNTER — Other Ambulatory Visit: Payer: Self-pay

## 2021-11-23 ENCOUNTER — Emergency Department (HOSPITAL_COMMUNITY)
Admission: EM | Admit: 2021-11-23 | Discharge: 2021-11-23 | Disposition: A | Discharge status: Home / Self Care | Payer: Medicare PPO | Attending: Emergency Medicine | Admitting: Emergency Medicine

## 2021-11-23 DIAGNOSIS — S0501XA Injury of conjunctiva and corneal abrasion without foreign body, right eye, initial encounter: Secondary | ICD-10-CM | POA: Insufficient documentation

## 2021-11-23 DIAGNOSIS — S058X1A Other injuries of right eye and orbit, initial encounter: Secondary | ICD-10-CM | POA: Diagnosis present

## 2021-11-23 DIAGNOSIS — X58XXXA Exposure to other specified factors, initial encounter: Secondary | ICD-10-CM | POA: Diagnosis not present

## 2021-11-23 MED ORDER — TETRACAINE HCL 0.5 % OP SOLN
2.0000 [drp] | Freq: Once | OPHTHALMIC | Status: AC
  Administered 2021-11-23: 2 [drp] via OPHTHALMIC
  Filled 2021-11-23: qty 4

## 2021-11-23 MED ORDER — OXYCODONE HCL 5 MG PO TABS
5.0000 mg | ORAL_TABLET | Freq: Once | ORAL | Status: AC
  Administered 2021-11-23: 5 mg via ORAL
  Filled 2021-11-23: qty 1

## 2021-11-23 MED ORDER — ONDANSETRON 8 MG PO TBDP: 8.0000 mg | ORAL_TABLET | Freq: Three times a day (TID) | ORAL | 0 refills | Status: DC | PRN

## 2021-11-23 MED ORDER — ONDANSETRON 8 MG PO TBDP
8.0000 mg | ORAL_TABLET | Freq: Once | ORAL | Status: AC
  Administered 2021-11-23: 8 mg via ORAL
  Filled 2021-11-23: qty 1

## 2021-11-23 MED ORDER — FLUORESCEIN SODIUM 1 MG OP STRP
1.0000 | ORAL_STRIP | Freq: Once | OPHTHALMIC | Status: AC
  Administered 2021-11-23: 1 via OPHTHALMIC
  Filled 2021-11-23: qty 1

## 2021-11-23 MED ORDER — OFLOXACIN 0.3 % OP SOLN
1.0000 [drp] | Freq: Four times a day (QID) | OPHTHALMIC | Status: DC
  Filled 2021-11-23: qty 5

## 2021-11-23 MED ORDER — OFLOXACIN 0.3 % OP SOLN: 1.0000 [drp] | Freq: Four times a day (QID) | OPHTHALMIC | 0 refills | Status: DC

## 2021-11-23 MED ORDER — OXYCODONE HCL 5 MG PO TABS: 5.0000 mg | ORAL_TABLET | ORAL | 0 refills | Status: DC | PRN

## 2021-11-23 NOTE — ED Provider Notes (Signed)
?[facility] ?Provider Note ? ? ?CSN: [id number] ?Arrival date & time: 11/23/21  0844 ? ?  ? ?History ? ?Chief Complaint  ?Patient presents with  ? Eye Problem  ? ? ?Martha Solomon is a 66 y.o. female. ? ? ?Eye Problem ? ?Patient with h/o of bilat pseudophakia, band keratopathy of right eye, and S/P right eye EDTA chelation (04/19/21) presents with pain and erythema to the right eye.  States she woke up with it this morning, she has been having clear drainage from the right eye.  Denies any blurry vision, does not wear contacts.  She is status post lens transplant to the right eye.  States she had a similar episode of this 2 months ago where she was diagnosed with a corneal abrasion. Denies trauma to eye, no fevers.  ? ?Home Medications ?Prior to Admission medications   ?Medication Sig Start Date End Date Taking? Authorizing Provider  ?ofloxacin (OCUFLOX) 0.3 % ophthalmic solution Place 1 drop into the right eye 4 (four) times daily. 11/23/21  Yes Sherrill Raring, PA-C  ?ondansetron (ZOFRAN-ODT) 8 MG disintegrating tablet Take 1 tablet (8 mg total) by mouth every 8 (eight) hours as needed for nausea or vomiting. 11/23/21  Yes Sherrill Raring, PA-C  ?oxyCODONE (ROXICODONE) 5 MG immediate release tablet Take 1 tablet (5 mg total) by mouth every 4 (four) hours as needed for up to 5 doses for severe pain. 11/23/21  Yes Sherrill Raring, PA-C  ?acetaminophen (TYLENOL) 500 MG tablet Take 1,000 mg by mouth every 6 (six) hours as needed for moderate pain or headache.    [provider]  ?ASTAXANTHIN PO Take 10 mg by mouth daily.     [provider]  ?atorvastatin (LIPITOR) 80 MG tablet Take 80 mg by mouth daily.     [provider]  ?Cholecalciferol (VITAMIN D) 50 MCG (2000 UT) tablet Take 2,000 Units by mouth daily.    [provider]  ?Coenzyme Q10 (COQ-10) 100 MG CAPS Take 100 mg by mouth daily.    [provider]  ?estradiol (VIVELLE-DOT) 0.1 MG/24HR patch APPLY ONE PATCH  TWICE A WEEK 11/01/20   Florian Buff, MD  ?fluticasone (FLONASE) 50 MCG/ACT nasal spray Place 1 spray into both nostrils daily as needed for allergies or rhinitis.    [provider]  ?hydrochlorothiazide (MICROZIDE) 12.5 MG capsule Take 12.5 mg by mouth daily. 02/28/20   [provider]  ?Astrid Drafts (MAXIMUM RED KRILL PO) Take 500 mg by mouth daily.    [provider]  ?losartan (COZAAR) 100 MG tablet Take 100 mg by mouth daily. 02/28/20   [provider]  ?Polyethyl Glycol-Propyl Glycol (SYSTANE OP) Place 1 drop into both eyes in the morning, at noon, and at bedtime.    [provider]  ?sodium chloride (OCEAN) 0.65 % SOLN nasal spray Place 1 spray into both nostrils as needed for congestion.    [provider]  ?vitamin C (ASCORBIC ACID) 500 MG tablet Take 500 mg by mouth daily.     [provider]  ?   ? ?Allergies    ?Reglan [metoclopramide]   ? ?Review of Systems   ?Review of Systems ? ?Physical Exam ?Updated Vital Signs ?BP 127/85 (BP Location: Right Arm)   Pulse 71   Temp 97.9 ?F (36.6 ?C) (Oral)   Resp 16   Ht '5\' 5"'$  (1.651 m)   Wt 87.5 kg   SpO2 99%   BMI 32.12 kg/m?  ?Physical  Exam ?Vitals and nursing note reviewed. Exam conducted with a chaperone present.  ?Constitutional:   ?   General: She is not in acute distress. ?   Appearance: Normal appearance.  ?HENT:  ?   Head: Normocephalic and atraumatic.  ?Eyes:  ?   General: Gaze aligned appropriately. No scleral icterus. ?   Intraocular pressure: Right eye pressure is 15 mmHg. Left eye pressure is 15 mmHg.  ?   Extraocular Movements: Extraocular movements intact.  ?   Right eye: No nystagmus.  ?   Left eye: No nystagmus.  ?   Conjunctiva/sclera:  ?   Right eye: Right conjunctiva is injected.  ?   Pupils: Pupils are equal, round, and reactive to light.  ?   Comments: Under fluorescein corneal abrasion noted to the right eye over the pupil.  Green fluorescein uptake, no foreign bodies  appreciated.  ?Skin: ?   Coloration: Skin is not jaundiced.  ?Neurological:  ?   Mental Status: She is alert. Mental status is at baseline.  ?   Coordination: Coordination normal.  ? ? ?ED Results / Procedures / Treatments   ?Labs ?(all labs ordered are listed, but only abnormal results are displayed) ?Labs Reviewed - No data to display ? ?EKG ?None ? ?Radiology ?No results found. ? ?Procedures ?Procedures  ? ? ?Medications Ordered in ED ?Medications  ?fluorescein ophthalmic strip 1 strip (1 strip Both Eyes Given by Other 11/23/21 0956)  ?tetracaine (PONTOCAINE) 0.5 % ophthalmic solution 2 drop (2 drops Both Eyes Given by Other 11/23/21 0956)  ?oxyCODONE (Oxy IR/ROXICODONE) immediate release tablet 5 mg (5 mg Oral Given 11/23/21 1028)  ?ondansetron (ZOFRAN-ODT) disintegrating tablet 8 mg (8 mg Oral Given 11/23/21 1029)  ? ? ?ED Course/ Medical Decision Making/ A&P ?  ?                        ?Medical Decision Making ?Risk ?Prescription drug management. ? ? ?This patient presents to the ED for concern of right eye pain and injection, this involves an extensive number of treatment options, and is a complaint that carries with it a high risk of complications and morbidity.  The differential diagnosis includes conjunctivitis, corneal abrasion, corneal ulcer, keratitis, uveitis ? ?Patient?s presentation is complicated by their history of ophthalmic conditions as documented in the HPI. ? ? ?Additional history obtained:  ? ?Independent historian: Patient's sister ? ?Reviewed external records including ophthalmic history and office visit from 04/25/2021 with Dr. Thayer Jew.  See HPI. ? ?  ? ?Medicines ordered and prescription drug management: ? ?I ordered medication including: Ofloxacin ? ?I have reviewed the patients home medicines and have made adjustments as needed ? ? ?Test Considered: ? ?Considered CT imaging of the orbits but doubt this is a similar.  Orbital cellulitis given the acuity, the corneal abrasion noted under  fluorescein.   ? ?Discussed options for workup with patient, risks and benefits, via shared decision making decided to forgo imaging at this time ? ?Reevaluation: ? ?After the interventions noted above, I reevaluated the patient and found the pain improved with tetracaine. ? ? ?Problems addressed / ED Course: ?Patient presents with right eye pain.  Physical exam notable for corneal abrasion.  Ofloxacin ordered and given here in the ED. patient acuity 20/50 bilaterally and in each eye individually. ?  ?Social Determinants of Health: ?Past ophthalmology follow-up available outpatient. ?  ?Disposition: ? ? ?After consideration of the diagnostic results and the patients response to treatment,  I feel that the patent would benefit from antibiotic drops, outpatient follow-up with ophthalmology Monday with strict return precautions over the weekend.. ? ?  ?Discussed HPI, physical exam and plan of care for this patient with attending Isla Pence. The attending physician evaluated this patient as part of a shared visit and agrees with plan of care. ? ? ? ? ? ? ? ? ?Final Clinical Impression(s) / ED Diagnoses ?Final diagnoses:  ?Abrasion of right cornea, initial encounter  ? ? ?Rx / DC Orders ?ED Discharge Orders   ? ?      Ordered  ?  ofloxacin (OCUFLOX) 0.3 % ophthalmic solution  4 times daily       ? 11/23/21 1016  ?  oxyCODONE (ROXICODONE) 5 MG immediate release tablet  Every 4 hours PRN       ? 11/23/21 1022  ?  ondansetron (ZOFRAN-ODT) 8 MG disintegrating tablet  Every 8 hours PRN       ? 11/23/21 1022  ? ?  ?  ? ?  ? ? ?  ?Sherrill Raring, PA-C ?11/23/21 1305 ? ?  ?Isla Pence, MD ?11/24/21 [phone] ? ?

## 2021-11-23 NOTE — ED Triage Notes (Signed)
Pt arrived to triage complaining of right eye pain and redness that started this morning. Pt states the has had infection 2 months ago in the same eye ? ?Denies trauma to eye  ?Pt states light makes pain worse and it is hard to open her eye to see due to the pain ?

## 2021-11-23 NOTE — ED Notes (Signed)
During the visual acuity test the Pt could only see two letters at 20/50. Pt states that her right eye was hurting and the letters were very blurry. ?

## 2021-11-23 NOTE — Discharge Instructions (Signed)
Use the ofloxacin drops 4 times daily to the right eye.  Follow-up with your ophthalmologist early next week.  Return to the ED if you start having loss of vision, swelling of the eye, blurry vision, new or concerning symptoms. ?

## 2021-11-26 DIAGNOSIS — H04123 Dry eye syndrome of bilateral lacrimal glands: Secondary | ICD-10-CM | POA: Diagnosis not present

## 2021-11-26 DIAGNOSIS — H18831 Recurrent erosion of cornea, right eye: Secondary | ICD-10-CM | POA: Diagnosis not present

## 2021-12-06 DIAGNOSIS — H04123 Dry eye syndrome of bilateral lacrimal glands: Secondary | ICD-10-CM | POA: Diagnosis not present

## 2021-12-06 DIAGNOSIS — H17821 Peripheral opacity of cornea, right eye: Secondary | ICD-10-CM | POA: Diagnosis not present

## 2021-12-06 DIAGNOSIS — S0501XD Injury of conjunctiva and corneal abrasion without foreign body, right eye, subsequent encounter: Secondary | ICD-10-CM | POA: Diagnosis not present

## 2021-12-13 ENCOUNTER — Encounter (HOSPITAL_COMMUNITY): Payer: Self-pay

## 2021-12-13 ENCOUNTER — Ambulatory Visit (HOSPITAL_COMMUNITY)
Admission: RE | Admit: 2021-12-13 | Discharge: 2021-12-13 | Disposition: A | Discharge status: Home / Self Care | Payer: Medicare PPO | Source: Ambulatory Visit | Attending: Obstetrics & Gynecology | Admitting: Obstetrics & Gynecology

## 2021-12-13 DIAGNOSIS — R928 Other abnormal and inconclusive findings on diagnostic imaging of breast: Secondary | ICD-10-CM | POA: Insufficient documentation

## 2021-12-13 DIAGNOSIS — R921 Mammographic calcification found on diagnostic imaging of breast: Secondary | ICD-10-CM | POA: Diagnosis not present

## 2022-01-03 DIAGNOSIS — H17821 Peripheral opacity of cornea, right eye: Secondary | ICD-10-CM | POA: Diagnosis not present

## 2022-01-03 DIAGNOSIS — H04123 Dry eye syndrome of bilateral lacrimal glands: Secondary | ICD-10-CM | POA: Diagnosis not present

## 2022-01-03 DIAGNOSIS — S0501XD Injury of conjunctiva and corneal abrasion without foreign body, right eye, subsequent encounter: Secondary | ICD-10-CM | POA: Diagnosis not present

## 2022-01-08 DIAGNOSIS — Z1331 Encounter for screening for depression: Secondary | ICD-10-CM | POA: Diagnosis not present

## 2022-01-08 DIAGNOSIS — R7309 Other abnormal glucose: Secondary | ICD-10-CM | POA: Diagnosis not present

## 2022-01-08 DIAGNOSIS — E6609 Other obesity due to excess calories: Secondary | ICD-10-CM | POA: Diagnosis not present

## 2022-01-08 DIAGNOSIS — E782 Mixed hyperlipidemia: Secondary | ICD-10-CM | POA: Diagnosis not present

## 2022-01-08 DIAGNOSIS — Z0001 Encounter for general adult medical examination with abnormal findings: Secondary | ICD-10-CM | POA: Diagnosis not present

## 2022-01-08 DIAGNOSIS — Z6830 Body mass index (BMI) 30.0-30.9, adult: Secondary | ICD-10-CM | POA: Diagnosis not present

## 2022-01-08 DIAGNOSIS — I10 Essential (primary) hypertension: Secondary | ICD-10-CM | POA: Diagnosis not present

## 2022-01-17 DIAGNOSIS — H04123 Dry eye syndrome of bilateral lacrimal glands: Secondary | ICD-10-CM | POA: Diagnosis not present

## 2022-01-17 DIAGNOSIS — H18513 Endothelial corneal dystrophy, bilateral: Secondary | ICD-10-CM | POA: Diagnosis not present

## 2022-01-17 DIAGNOSIS — H18831 Recurrent erosion of cornea, right eye: Secondary | ICD-10-CM | POA: Diagnosis not present

## 2022-02-07 DIAGNOSIS — H18513 Endothelial corneal dystrophy, bilateral: Secondary | ICD-10-CM | POA: Diagnosis not present

## 2022-02-07 DIAGNOSIS — H18831 Recurrent erosion of cornea, right eye: Secondary | ICD-10-CM | POA: Diagnosis not present

## 2022-02-07 DIAGNOSIS — H04123 Dry eye syndrome of bilateral lacrimal glands: Secondary | ICD-10-CM | POA: Diagnosis not present

## 2022-02-21 DIAGNOSIS — H18831 Recurrent erosion of cornea, right eye: Secondary | ICD-10-CM | POA: Diagnosis not present

## 2022-02-21 DIAGNOSIS — H18513 Endothelial corneal dystrophy, bilateral: Secondary | ICD-10-CM | POA: Diagnosis not present

## 2022-02-21 DIAGNOSIS — H04123 Dry eye syndrome of bilateral lacrimal glands: Secondary | ICD-10-CM | POA: Diagnosis not present

## 2022-03-06 DIAGNOSIS — Z961 Presence of intraocular lens: Secondary | ICD-10-CM | POA: Diagnosis not present

## 2022-03-06 DIAGNOSIS — Z79899 Other long term (current) drug therapy: Secondary | ICD-10-CM | POA: Diagnosis not present

## 2022-03-06 DIAGNOSIS — H18421 Band keratopathy, right eye: Secondary | ICD-10-CM | POA: Diagnosis not present

## 2022-03-06 DIAGNOSIS — H18513 Endothelial corneal dystrophy, bilateral: Secondary | ICD-10-CM | POA: Diagnosis not present

## 2022-03-14 DIAGNOSIS — H18421 Band keratopathy, right eye: Secondary | ICD-10-CM | POA: Diagnosis not present

## 2022-03-15 DIAGNOSIS — Z48810 Encounter for surgical aftercare following surgery on the sense organs: Secondary | ICD-10-CM | POA: Diagnosis not present

## 2022-03-15 DIAGNOSIS — H18513 Endothelial corneal dystrophy, bilateral: Secondary | ICD-10-CM | POA: Diagnosis not present

## 2022-03-15 DIAGNOSIS — H18421 Band keratopathy, right eye: Secondary | ICD-10-CM | POA: Diagnosis not present

## 2022-03-15 DIAGNOSIS — Z961 Presence of intraocular lens: Secondary | ICD-10-CM | POA: Diagnosis not present

## 2022-03-15 DIAGNOSIS — Z79899 Other long term (current) drug therapy: Secondary | ICD-10-CM | POA: Diagnosis not present

## 2022-03-20 DIAGNOSIS — Z961 Presence of intraocular lens: Secondary | ICD-10-CM | POA: Diagnosis not present

## 2022-03-20 DIAGNOSIS — H18421 Band keratopathy, right eye: Secondary | ICD-10-CM | POA: Diagnosis not present

## 2022-03-20 DIAGNOSIS — H18513 Endothelial corneal dystrophy, bilateral: Secondary | ICD-10-CM | POA: Diagnosis not present

## 2022-03-20 DIAGNOSIS — Z48810 Encounter for surgical aftercare following surgery on the sense organs: Secondary | ICD-10-CM | POA: Diagnosis not present

## 2022-03-27 DIAGNOSIS — H18513 Endothelial corneal dystrophy, bilateral: Secondary | ICD-10-CM | POA: Diagnosis not present

## 2022-03-27 DIAGNOSIS — H18831 Recurrent erosion of cornea, right eye: Secondary | ICD-10-CM | POA: Diagnosis not present

## 2022-03-27 DIAGNOSIS — H04121 Dry eye syndrome of right lacrimal gland: Secondary | ICD-10-CM | POA: Diagnosis not present

## 2022-04-03 DIAGNOSIS — H18513 Endothelial corneal dystrophy, bilateral: Secondary | ICD-10-CM | POA: Diagnosis not present

## 2022-04-03 DIAGNOSIS — H04123 Dry eye syndrome of bilateral lacrimal glands: Secondary | ICD-10-CM | POA: Diagnosis not present

## 2022-04-03 DIAGNOSIS — H18831 Recurrent erosion of cornea, right eye: Secondary | ICD-10-CM | POA: Diagnosis not present

## 2022-04-04 DIAGNOSIS — Z23 Encounter for immunization: Secondary | ICD-10-CM | POA: Diagnosis not present

## 2022-05-08 DIAGNOSIS — H18831 Recurrent erosion of cornea, right eye: Secondary | ICD-10-CM | POA: Diagnosis not present

## 2022-05-08 DIAGNOSIS — H18513 Endothelial corneal dystrophy, bilateral: Secondary | ICD-10-CM | POA: Diagnosis not present

## 2022-05-08 DIAGNOSIS — H04123 Dry eye syndrome of bilateral lacrimal glands: Secondary | ICD-10-CM | POA: Diagnosis not present

## 2022-06-10 DIAGNOSIS — Z6829 Body mass index (BMI) 29.0-29.9, adult: Secondary | ICD-10-CM | POA: Diagnosis not present

## 2022-06-10 DIAGNOSIS — J069 Acute upper respiratory infection, unspecified: Secondary | ICD-10-CM | POA: Diagnosis not present

## 2022-06-10 DIAGNOSIS — E6609 Other obesity due to excess calories: Secondary | ICD-10-CM | POA: Diagnosis not present

## 2022-07-11 ENCOUNTER — Other Ambulatory Visit (HOSPITAL_COMMUNITY): Payer: Self-pay | Admitting: Obstetrics & Gynecology

## 2022-07-11 DIAGNOSIS — R921 Mammographic calcification found on diagnostic imaging of breast: Secondary | ICD-10-CM

## 2022-08-01 ENCOUNTER — Ambulatory Visit (HOSPITAL_COMMUNITY)
Admission: RE | Admit: 2022-08-01 | Discharge: 2022-08-01 | Disposition: A | Discharge status: Home / Self Care | Payer: Medicare PPO | Source: Ambulatory Visit | Attending: Obstetrics & Gynecology | Admitting: Obstetrics & Gynecology

## 2022-08-01 ENCOUNTER — Encounter (HOSPITAL_COMMUNITY): Payer: Self-pay

## 2022-08-01 DIAGNOSIS — R921 Mammographic calcification found on diagnostic imaging of breast: Secondary | ICD-10-CM

## 2022-08-07 DIAGNOSIS — H18831 Recurrent erosion of cornea, right eye: Secondary | ICD-10-CM | POA: Diagnosis not present

## 2022-08-07 DIAGNOSIS — H18513 Endothelial corneal dystrophy, bilateral: Secondary | ICD-10-CM | POA: Diagnosis not present

## 2022-08-07 DIAGNOSIS — H04123 Dry eye syndrome of bilateral lacrimal glands: Secondary | ICD-10-CM | POA: Diagnosis not present

## 2022-08-16 DIAGNOSIS — E669 Obesity, unspecified: Secondary | ICD-10-CM | POA: Diagnosis not present

## 2022-09-19 ENCOUNTER — Telehealth: Payer: Self-pay | Admitting: *Deleted

## 2022-09-19 NOTE — Progress Notes (Signed)
  Care Coordination   Note   09/19/2022 Name: Martha Solomon MRN: [id number] DOB: Nov 18, 1955  Martha Solomon is a 67 y.o. year old female who sees Redmond School, MD for primary care. I reached out to Martha Solomon by phone today to offer care coordination services.  [name] was given information about Care Coordination services today including:   The Care Coordination services include support from the care team which includes your Nurse Coordinator, Clinical Social Worker, or Pharmacist.  The Care Coordination team is here to help remove barriers to the health concerns and goals most important to you. Care Coordination services are voluntary, and the patient may decline or stop services at any time by request to their care team member.   Care Coordination Consent Status: Patient agreed to services and verbal consent obtained.   Follow up plan:  Telephone appointment with care coordination team member scheduled for:  10/07/22  Encounter Outcome:  Pt. Scheduled  [place]  Direct Dial: [phone]

## 2022-10-07 ENCOUNTER — Encounter: Payer: Self-pay | Admitting: *Deleted

## 2022-10-07 ENCOUNTER — Ambulatory Visit: Payer: Self-pay | Admitting: *Deleted

## 2022-10-07 NOTE — Patient Outreach (Signed)
  Care Coordination   Initial Visit Note   10/07/2022 Name: Martha Solomon MRN: [id number] DOB: September 02, 1955  Martha Solomon is a 67 y.o. year old female who sees Redmond School, MD for primary care. I spoke with  Martha Solomon by phone today. Martha Solomon lives alone and is the primary caregiver for her elderly mother and aunt (who has been on dialysis for >20 years). She is active with her church and is well connected with friends, family, and neighbors. Her husband died 6 months ago. She reports that she is coping well and has great support.   What matters to the patients health and wellness today?  Ongoing Self Management of Chronic Medical Conditions     Goals Addressed             This Visit's Progress    COMPLETED: Blood Pressure Management (no follow-up needed)       Care Coordination Goals: Patient will take medications as directed and report any negative side effects to provider  Patient will use a pill box/organizer to help keep up with when to take medications Patient will monitor and record blood pressure 2-3 times a week and as needed and will call PCP or specialist with any readings outside of recommended range Patient will keep all recommended follow-up appointments with PCP and specialists (cardiology, nephrology, etc) Patient will take blood pressure log to PCP and specialty appointments for review Patient will follow a low sodium/DASH diet  Patient will reach out to [place] Coordinator [phone] with any care coordination or resource needs       COMPLETED: Stay Physically and Socially Active       Care Coordination Goals: Patient will continue to routinely engage with family/friends/neighbors/church family Patient will continue to stay physically active Patient will reach out to family/friends for assistance as needed Patient will reach out to Care Coordination Team with any resource, care coordination, or counseling service needs        SDOH assessments  and interventions completed:  Yes  SDOH Interventions Today    Flowsheet Row Most Recent Value  SDOH Interventions   Food Insecurity Interventions Intervention Not Indicated  Housing Interventions Intervention Not Indicated  Transportation Interventions Intervention Not Indicated  Financial Strain Interventions Intervention Not Indicated  Physical Activity Interventions Intervention Not Indicated  Stress Interventions Intervention Not Indicated  Social Connections Interventions Intervention Not Indicated        Care Coordination Interventions:  Yes, provided  Interventions Today    Flowsheet Row Most Recent Value  Chronic Disease   Chronic disease during today's visit Hypertension (HTN)  General Interventions   General Interventions Discussed/Reviewed General Interventions Discussed, Lipid Profile, Labs, Doctor Visits, Durable Medical Equipment (DME)  Labs Kidney Function  Doctor Visits Discussed/Reviewed Doctor Visits Discussed, PCP  Durable Medical Equipment (DME) BP Cuff  PCP/Specialist Visits Compliance with follow-up visit  Exercise Interventions   Exercise Discussed/Reviewed Physical Activity, Exercise Discussed  Physical Activity Discussed/Reviewed Physical Activity Discussed  Education Interventions   Education Provided Provided Education  Provided Verbal Education On Medication, When to see the doctor, Nutrition, Mental Health/Coping with Illness  [Blood Pressure Monitoring. Coping with death of husband of 44 years 6 months ago]  Nutrition Interventions   Nutrition Discussed/Reviewed Decreasing salt, Nutrition Discussed  Pharmacy Interventions   Pharmacy Dicussed/Reviewed Medications and their functions      Follow up plan: No further intervention required.   Encounter Outcome:  Pt. Visit Completed

## 2022-10-17 ENCOUNTER — Encounter: Payer: Self-pay | Admitting: Obstetrics & Gynecology

## 2022-10-17 ENCOUNTER — Ambulatory Visit (INDEPENDENT_AMBULATORY_CARE_PROVIDER_SITE_OTHER): Payer: Medicare PPO | Admitting: Obstetrics & Gynecology

## 2022-10-17 VITALS — BP 137/80 | HR 71 | Ht 65.0 in | Wt 173.0 lb

## 2022-10-17 DIAGNOSIS — Z01419 Encounter for gynecological examination (general) (routine) without abnormal findings: Secondary | ICD-10-CM | POA: Diagnosis not present

## 2022-10-17 NOTE — Progress Notes (Signed)
Subjective:     Martha Solomon is a 67 y.o. female here for a routine exam.  No LMP recorded. Patient has had a hysterectomy. [id number] Birth Control Method:  menopausal + hysterectomy Menstrual Calendar(currently): amenorrhea  Current complaints: none.   Current acute medical issues:  lost her husband last september   Recent Gynecologic History No LMP recorded. Patient has had a hysterectomy. Last Pap: na,   Last mammogram: 08/01/22,  normal  Past Medical History:  Diagnosis Date   Diabetes mellitus without complication ([place])    pre-diabetes   High cholesterol    Hypertension     Past Surgical History:  Procedure Laterality Date   ABDOMINAL HYSTERECTOMY     CATARACT EXTRACTION W/PHACO Left 03/31/2020   Procedure: CATARACT EXTRACTION PHACO AND INTRAOCULAR LENS PLACEMENT ([place]);  Surgeon: Baruch Goldmann, MD;  Location: [facility];  Service: Ophthalmology;  Laterality: Left;  CDE: 2.92   CATARACT EXTRACTION W/PHACO Right 04/14/2020   Procedure: CATARACT EXTRACTION PHACO AND INTRAOCULAR LENS PLACEMENT RIGHT EYE;  Surgeon: Baruch Goldmann, MD;  Location: [facility];  Service: Ophthalmology;  Laterality: Right;  CDE: 3.76   CHOLECYSTECTOMY      OB History     Gravida  2   Para      Term      Preterm      AB  2   Living         SAB  2   IAB      Ectopic      Multiple      Live Births              Social History   Socioeconomic History   Marital status: Widowed    Spouse name: Not on file   Number of children: Not on file   Years of education: Not on file   Highest education level: Not on file  Occupational History   Not on file  Tobacco Use   Smoking status: Never   Smokeless tobacco: Never  Vaping Use   Vaping Use: Never used  Substance and Sexual Activity   Alcohol use: No    Alcohol/week: 0.0 standard drinks of alcohol    Comment: occassional   Drug use: No   Sexual activity: Not Currently    Birth control/protection: Surgical    Comment: hyst  Other  Topics Concern   Not on file  Social History Narrative   Not on file   Social Determinants of Health   Financial Resource Strain: Low Risk  (10/07/2022)   Overall Financial Resource Strain (CARDIA)    Difficulty of Paying Living Expenses: Not hard at all  Food Insecurity: No Food Insecurity (10/07/2022)   Hunger Vital Sign    Worried About Running Out of Food in the Last Year: Never true    Ran Out of Food in the Last Year: Never true  Transportation Needs: No Transportation Needs (10/07/2022)   PRAPARE - Hydrologist (Medical): No    Lack of Transportation (Non-Medical): No  Physical Activity: Sufficiently Active (10/07/2022)   Exercise Vital Sign    Days of Exercise per Week: 7 days    Minutes of Exercise per Session: 30 min  Stress: No Stress Concern Present (10/07/2022)   [place]    Feeling of Stress : Only a little  Social Connections: Moderately Integrated (10/07/2022)   Social Connection and Isolation Panel [NHANES]    Frequency of  Communication with Friends and Family: Three times a week    Frequency of Social Gatherings with Friends and Family: Three times a week    Attends Religious Services: More than 4 times per year    Active Member of Clubs or Organizations: Yes    Attends Archivist Meetings: More than 4 times per year    Marital Status: Widowed    Family History  Problem Relation Age of Onset   Cancer Paternal Aunt        ovarian, breast and pancreatic   Cancer Father        lung   Stroke Father    Heart attack Paternal Grandfather    Dementia Mother    High Cholesterol Brother    High Cholesterol Sister    HIV/AIDS Brother    Hypertension Sister      Current Outpatient Medications:    acetaminophen (TYLENOL) 500 MG tablet, Take 1,000 mg by mouth every 6 (six) hours as needed for moderate pain or headache., Disp: , Rfl:    ASTAXANTHIN PO, Take 10 mg  by mouth daily. , Disp: , Rfl:    atorvastatin (LIPITOR) 80 MG tablet, Take 80 mg by mouth daily. , Disp: , Rfl:    Cholecalciferol (VITAMIN D) 50 MCG (2000 UT) tablet, Take 2,000 Units by mouth daily., Disp: , Rfl:    Coenzyme Q10 (COQ-10) 100 MG CAPS, Take 100 mg by mouth daily., Disp: , Rfl:    fluticasone (FLONASE) 50 MCG/ACT nasal spray, Place 1 spray into both nostrils daily as needed for allergies or rhinitis., Disp: , Rfl:    hydrochlorothiazide (MICROZIDE) 12.5 MG capsule, Take 12.5 mg by mouth daily., Disp: , Rfl:    Krill Oil (MAXIMUM RED KRILL PO), Take 1,000 mg by mouth daily., Disp: , Rfl:    losartan (COZAAR) 100 MG tablet, Take 100 mg by mouth daily., Disp: , Rfl:    multivitamin-lutein (OCUVITE-LUTEIN) CAPS capsule, Take 1 capsule by mouth daily., Disp: , Rfl:    Polyethyl Glycol-Propyl Glycol (SYSTANE OP), Place 1 drop into both eyes in the morning, at noon, and at bedtime., Disp: , Rfl:    sodium chloride (OCEAN) 0.65 % SOLN nasal spray, Place 1 spray into both nostrils as needed for congestion., Disp: , Rfl:    vitamin C (ASCORBIC ACID) 500 MG tablet, Take 500 mg by mouth daily. , Disp: , Rfl:   Review of Systems  Review of Systems  Constitutional: Negative for fever, chills, weight loss, malaise/fatigue and diaphoresis.  HENT: Negative for hearing loss, ear pain, nosebleeds, congestion, sore throat, neck pain, tinnitus and ear discharge.   Eyes: Negative for blurred vision, double vision, photophobia, pain, discharge and redness.  Respiratory: Negative for cough, hemoptysis, sputum production, shortness of breath, wheezing and stridor.   Cardiovascular: Negative for chest pain, palpitations, orthopnea, claudication, leg swelling and PND.  Gastrointestinal: negative for abdominal pain. Negative for heartburn, nausea, vomiting, diarrhea, constipation, blood in stool and melena.  Genitourinary: Negative for dysuria, urgency, frequency, hematuria and flank pain.   Musculoskeletal: Negative for myalgias, back pain, joint pain and falls.  Skin: Negative for itching and rash.  Neurological: Negative for dizziness, tingling, tremors, sensory change, speech change, focal weakness, seizures, loss of consciousness, weakness and headaches.  Endo/Heme/Allergies: Negative for environmental allergies and polydipsia. Does not bruise/bleed easily.  Psychiatric/Behavioral: Negative for depression, suicidal ideas, hallucinations, memory loss and substance abuse. The patient is not nervous/anxious and does not have insomnia.  Objective:  Blood pressure 137/80, pulse 71, height 5\' 5"  (1.651 m), weight 173 lb (78.5 kg).   Physical Exam  Vitals reviewed. Constitutional: She is oriented to person, place, and time. She appears well-developed and well-nourished.  HENT:  Head: Normocephalic and atraumatic.        Right Ear: External ear normal.  Left Ear: External ear normal.  Nose: Nose normal.  Mouth/Throat: Oropharynx is clear and moist.  Eyes: Conjunctivae and EOM are normal. Pupils are equal, round, and reactive to light. Right eye exhibits no discharge. Left eye exhibits no discharge. No scleral icterus.  Neck: Normal range of motion. Neck supple. No tracheal deviation present. No thyromegaly present.  Cardiovascular: Normal rate, regular rhythm, normal heart sounds and intact distal pulses.  Exam reveals no gallop and no friction rub.   No murmur heard. Respiratory: Effort normal and breath sounds normal. No respiratory distress. She has no wheezes. She has no rales. She exhibits no tenderness.  GI: Soft. Bowel sounds are normal. She exhibits no distension and no mass. There is no tenderness. There is no rebound and no guarding.  Genitourinary:  Breasts no masses skin changes or nipple changes bilaterally      Vulva is normal without lesions Vagina is pink moist without discharge Cervix absent Uterus is absent Adnexa is negative  {Rectal    hemoccult  negative, normal tone, no masses  Musculoskeletal: Normal range of motion. She exhibits no edema and no tenderness.  Neurological: She is alert and oriented to person, place, and time. She has normal reflexes. She displays normal reflexes. No cranial nerve deficit. She exhibits normal muscle tone. Coordination normal.  Skin: Skin is warm and dry. No rash noted. No erythema. No pallor.  Psychiatric: She has a normal mood and affect. Her behavior is normal. Judgment and thought content normal.       Medications Ordered at today's visit: No orders of the defined types were placed in this encounter.   Other orders placed at today's visit: No orders of the defined types were placed in this encounter.     Assessment:    Normal Gyn exam.    Plan:    Follow up in: 3 years.     Return in about 3 years (around 10/16/2025) for yearly.

## 2023-01-13 ENCOUNTER — Other Ambulatory Visit (HOSPITAL_COMMUNITY): Payer: Self-pay | Admitting: Obstetrics & Gynecology

## 2023-01-13 DIAGNOSIS — R921 Mammographic calcification found on diagnostic imaging of breast: Secondary | ICD-10-CM

## 2023-02-04 ENCOUNTER — Ambulatory Visit (HOSPITAL_COMMUNITY)
Admission: RE | Admit: 2023-02-04 | Discharge: 2023-02-04 | Disposition: A | Discharge status: Home / Self Care | Payer: Medicare PPO | Source: Ambulatory Visit | Attending: Obstetrics & Gynecology | Admitting: Obstetrics & Gynecology

## 2023-02-04 ENCOUNTER — Encounter (HOSPITAL_COMMUNITY): Payer: Self-pay

## 2023-02-04 DIAGNOSIS — R92323 Mammographic fibroglandular density, bilateral breasts: Secondary | ICD-10-CM | POA: Diagnosis not present

## 2023-02-04 DIAGNOSIS — R921 Mammographic calcification found on diagnostic imaging of breast: Secondary | ICD-10-CM

## 2023-02-04 DIAGNOSIS — R928 Other abnormal and inconclusive findings on diagnostic imaging of breast: Secondary | ICD-10-CM | POA: Diagnosis not present

## 2023-02-05 DIAGNOSIS — H18831 Recurrent erosion of cornea, right eye: Secondary | ICD-10-CM | POA: Diagnosis not present

## 2023-02-05 DIAGNOSIS — H18513 Endothelial corneal dystrophy, bilateral: Secondary | ICD-10-CM | POA: Diagnosis not present

## 2023-02-05 DIAGNOSIS — H04123 Dry eye syndrome of bilateral lacrimal glands: Secondary | ICD-10-CM | POA: Diagnosis not present

## 2023-02-19 DIAGNOSIS — G9332 Myalgic encephalomyelitis/chronic fatigue syndrome: Secondary | ICD-10-CM | POA: Diagnosis not present

## 2023-02-19 DIAGNOSIS — Z1331 Encounter for screening for depression: Secondary | ICD-10-CM | POA: Diagnosis not present

## 2023-02-19 DIAGNOSIS — H17811 Minor opacity of cornea, right eye: Secondary | ICD-10-CM | POA: Diagnosis not present

## 2023-02-19 DIAGNOSIS — Z0001 Encounter for general adult medical examination with abnormal findings: Secondary | ICD-10-CM | POA: Diagnosis not present

## 2023-02-19 DIAGNOSIS — I10 Essential (primary) hypertension: Secondary | ICD-10-CM | POA: Diagnosis not present

## 2023-02-19 DIAGNOSIS — E782 Mixed hyperlipidemia: Secondary | ICD-10-CM | POA: Diagnosis not present

## 2023-02-19 DIAGNOSIS — F419 Anxiety disorder, unspecified: Secondary | ICD-10-CM | POA: Diagnosis not present

## 2023-02-19 DIAGNOSIS — E663 Overweight: Secondary | ICD-10-CM | POA: Diagnosis not present

## 2023-04-09 DIAGNOSIS — H18831 Recurrent erosion of cornea, right eye: Secondary | ICD-10-CM | POA: Diagnosis not present

## 2023-04-09 DIAGNOSIS — H04123 Dry eye syndrome of bilateral lacrimal glands: Secondary | ICD-10-CM | POA: Diagnosis not present

## 2023-04-09 DIAGNOSIS — H18513 Endothelial corneal dystrophy, bilateral: Secondary | ICD-10-CM | POA: Diagnosis not present

## 2023-04-09 DIAGNOSIS — H40041 Steroid responder, right eye: Secondary | ICD-10-CM | POA: Diagnosis not present

## 2023-06-11 IMAGING — MG MM DIGITAL SCREENING BILAT W/ TOMO AND CAD
6 of 10 series · 6 of 30 positions shown · non-contrast
Comparison: Previous exam(s).

CLINICAL DATA: Screening.

EXAM:
DIGITAL SCREENING BILATERAL MAMMOGRAM WITH TOMOSYNTHESIS AND CAD
TECHNIQUE: Bilateral screening digital craniocaudal and mediolateral oblique
mammograms were obtained. Bilateral screening digital breast
tomosynthesis was performed. The images were evaluated with
computer-aided detection.

[L CC synth-2D]
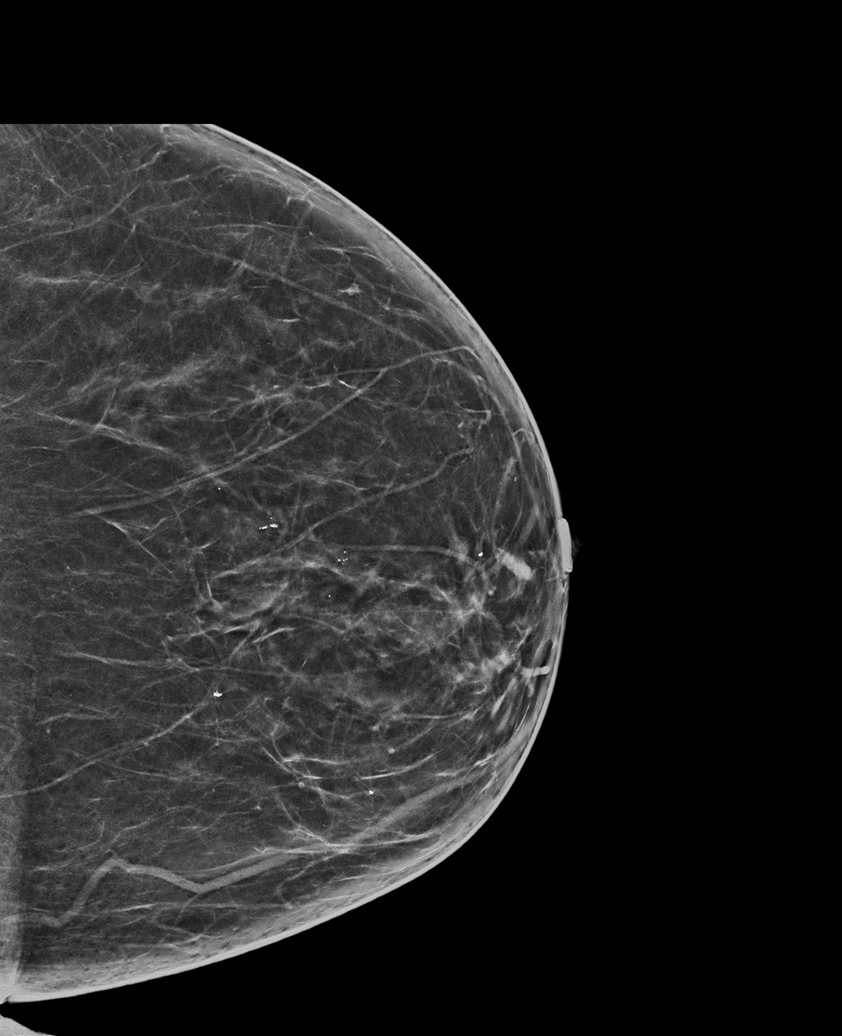

[R MLO synth-2D]
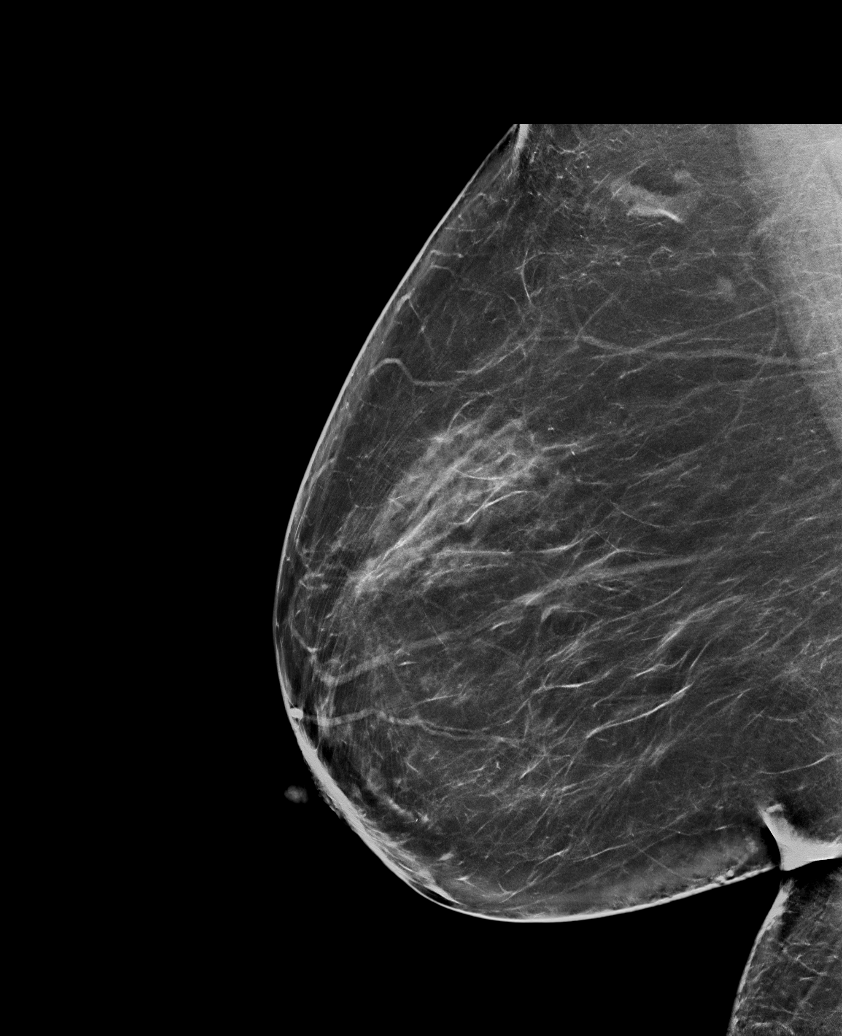

[L MLO synth-2D]
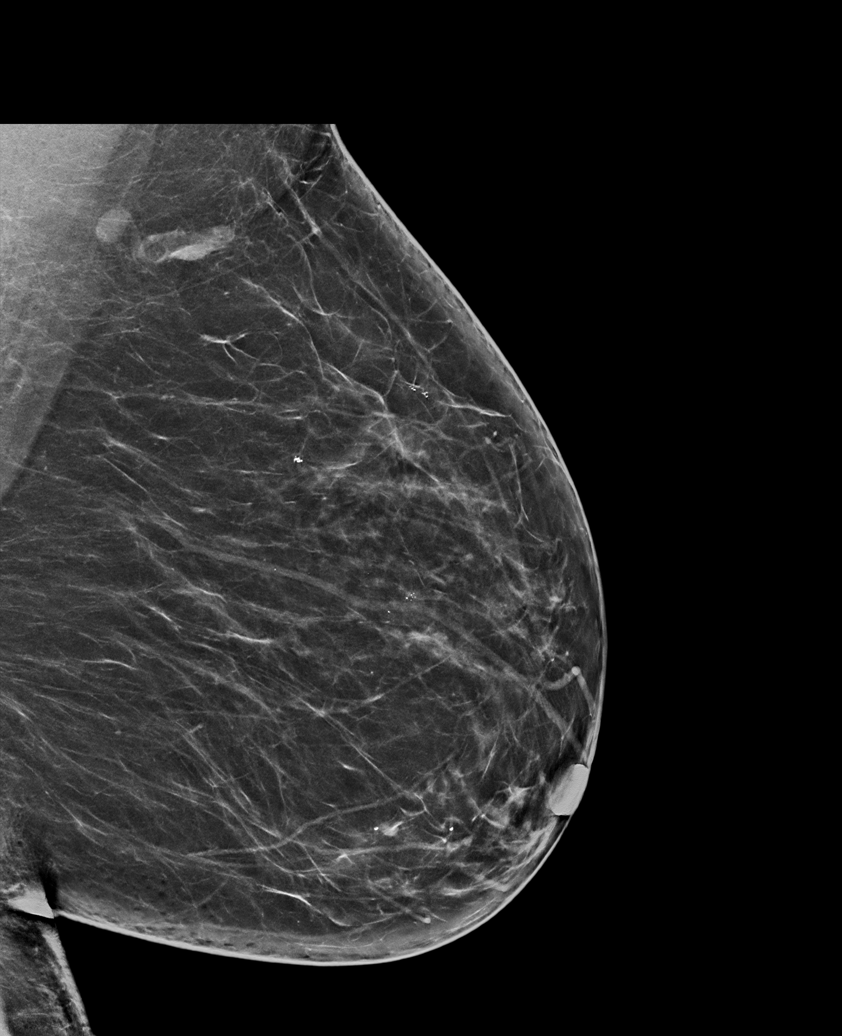

[R XCCL synth-2D]
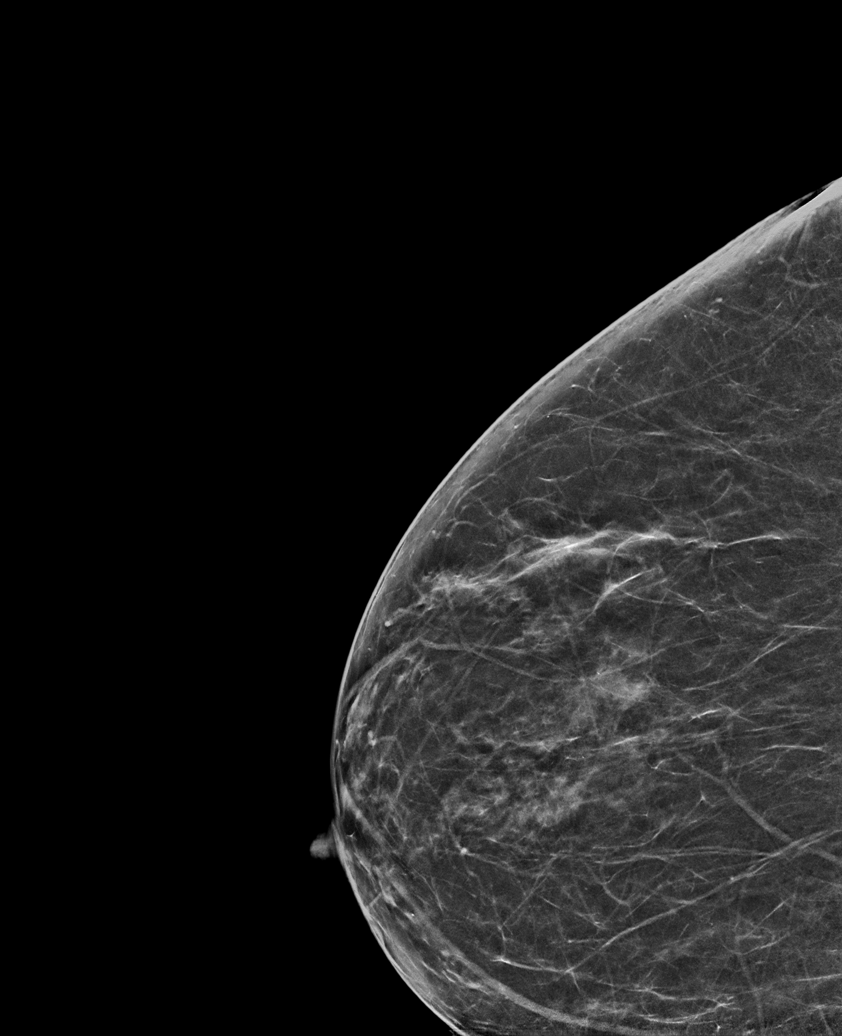

[R CC synth-2D]
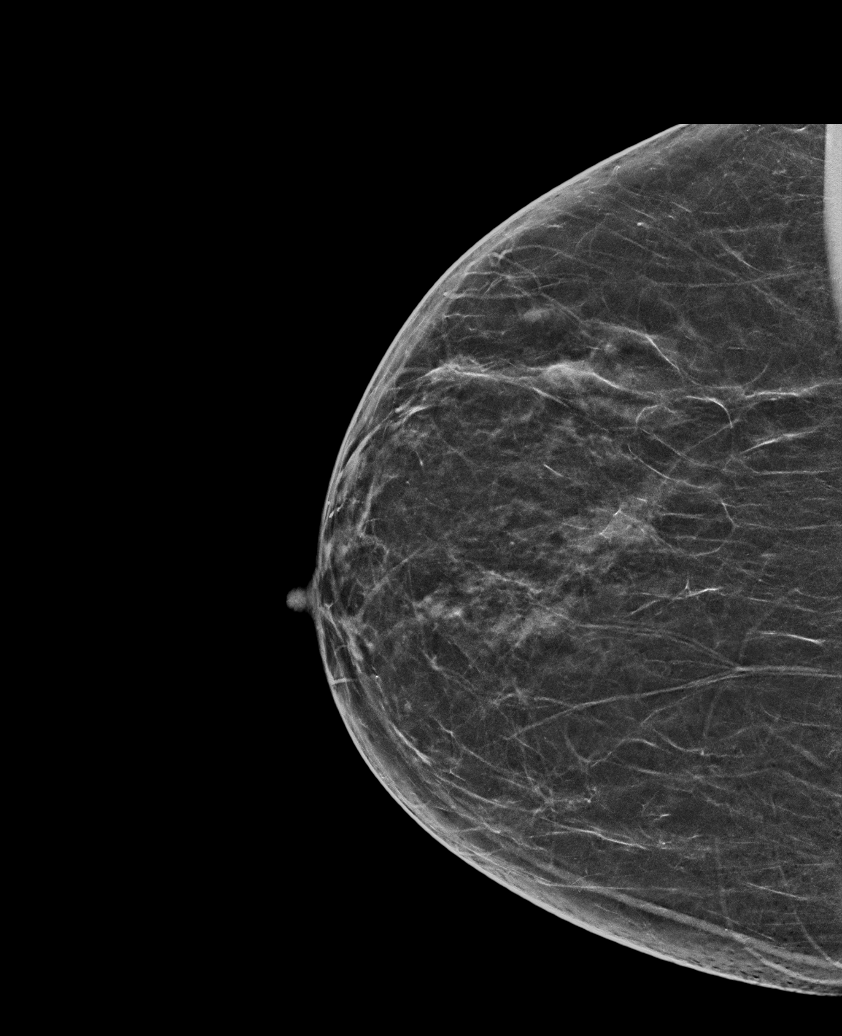

[L CC tomo · tomo slice 37/72.0]
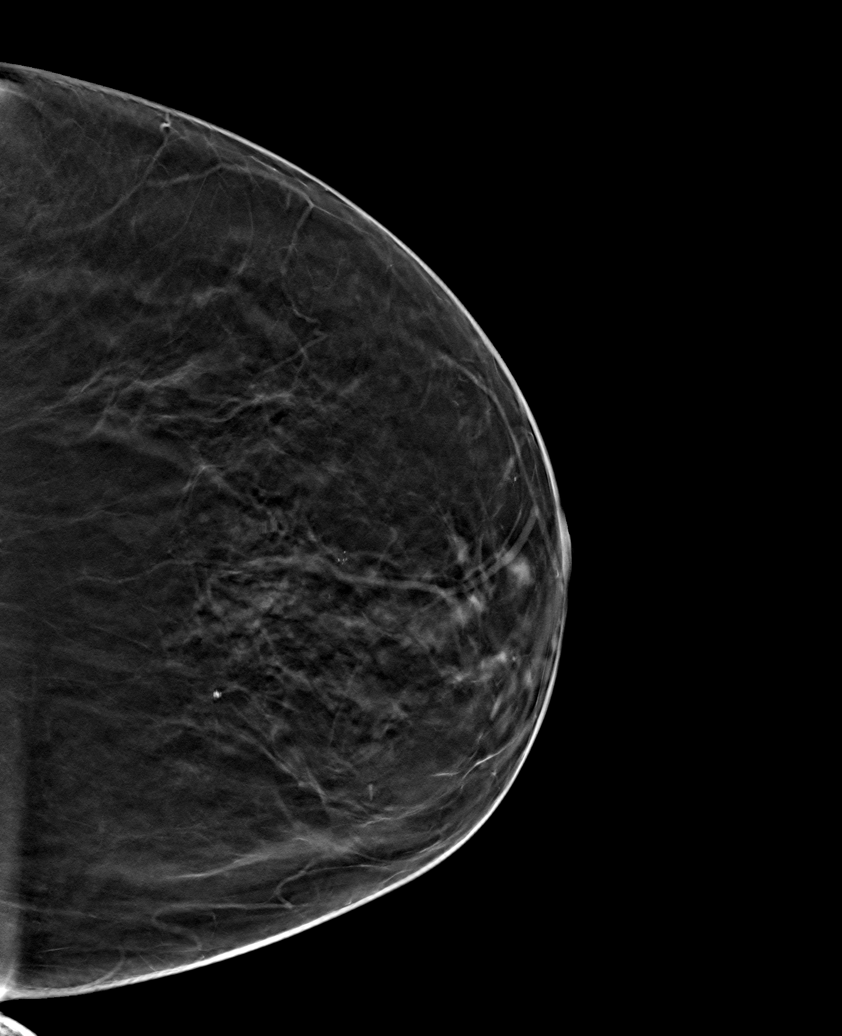

[6 of 30 positions shown; findings below may reference images not displayed]

ACR Breast Density Category b: There are scattered areas of
fibroglandular density.
FINDINGS: In the left breast, calcifications warrant further evaluation. In
the right breast, no findings suspicious for malignancy.
IMPRESSION: Further evaluation is suggested for calcifications in the left
breast.

RECOMMENDATION:
Diagnostic mammogram of the left breast. (Code:[id number])

The patient will be contacted regarding the findings, and additional
imaging will be scheduled.

BI-RADS CATEGORY  0: Incomplete. Need additional imaging evaluation
and/or prior mammograms for comparison.

## 2023-07-09 DIAGNOSIS — H18831 Recurrent erosion of cornea, right eye: Secondary | ICD-10-CM | POA: Diagnosis not present

## 2023-07-09 DIAGNOSIS — H18513 Endothelial corneal dystrophy, bilateral: Secondary | ICD-10-CM | POA: Diagnosis not present

## 2023-07-09 DIAGNOSIS — H04123 Dry eye syndrome of bilateral lacrimal glands: Secondary | ICD-10-CM | POA: Diagnosis not present

## 2023-07-09 DIAGNOSIS — H40041 Steroid responder, right eye: Secondary | ICD-10-CM | POA: Diagnosis not present

## 2023-07-09 DIAGNOSIS — H17821 Peripheral opacity of cornea, right eye: Secondary | ICD-10-CM | POA: Diagnosis not present

## 2023-07-12 DIAGNOSIS — Z1211 Encounter for screening for malignant neoplasm of colon: Secondary | ICD-10-CM | POA: Diagnosis not present

## 2023-11-19 DIAGNOSIS — H1131 Conjunctival hemorrhage, right eye: Secondary | ICD-10-CM | POA: Diagnosis not present

## 2023-11-19 DIAGNOSIS — H40041 Steroid responder, right eye: Secondary | ICD-10-CM | POA: Diagnosis not present

## 2023-11-19 DIAGNOSIS — H18513 Endothelial corneal dystrophy, bilateral: Secondary | ICD-10-CM | POA: Diagnosis not present

## 2023-11-19 DIAGNOSIS — H04123 Dry eye syndrome of bilateral lacrimal glands: Secondary | ICD-10-CM | POA: Diagnosis not present

## 2023-11-19 DIAGNOSIS — H17821 Peripheral opacity of cornea, right eye: Secondary | ICD-10-CM | POA: Diagnosis not present

## 2023-11-19 DIAGNOSIS — H18831 Recurrent erosion of cornea, right eye: Secondary | ICD-10-CM | POA: Diagnosis not present

## 2023-12-23 ENCOUNTER — Other Ambulatory Visit (HOSPITAL_COMMUNITY): Payer: Self-pay | Admitting: Obstetrics & Gynecology

## 2023-12-23 DIAGNOSIS — R921 Mammographic calcification found on diagnostic imaging of breast: Secondary | ICD-10-CM

## 2024-01-07 DIAGNOSIS — H04123 Dry eye syndrome of bilateral lacrimal glands: Secondary | ICD-10-CM | POA: Diagnosis not present

## 2024-01-07 DIAGNOSIS — H18513 Endothelial corneal dystrophy, bilateral: Secondary | ICD-10-CM | POA: Diagnosis not present

## 2024-01-07 DIAGNOSIS — H17821 Peripheral opacity of cornea, right eye: Secondary | ICD-10-CM | POA: Diagnosis not present

## 2024-01-07 DIAGNOSIS — H18831 Recurrent erosion of cornea, right eye: Secondary | ICD-10-CM | POA: Diagnosis not present

## 2024-02-10 ENCOUNTER — Other Ambulatory Visit (HOSPITAL_COMMUNITY): Payer: Self-pay | Admitting: Obstetrics & Gynecology

## 2024-02-10 ENCOUNTER — Encounter (HOSPITAL_COMMUNITY): Payer: Self-pay

## 2024-02-10 ENCOUNTER — Ambulatory Visit (HOSPITAL_COMMUNITY)
Admission: RE | Admit: 2024-02-10 | Discharge: 2024-02-10 | Discharge status: Home / Self Care | Source: Ambulatory Visit | Attending: Obstetrics & Gynecology | Admitting: Obstetrics & Gynecology

## 2024-02-10 ENCOUNTER — Ambulatory Visit (HOSPITAL_COMMUNITY)
Admission: RE | Admit: 2024-02-10 | Discharge: 2024-02-10 | Disposition: A | Discharge status: Home / Self Care | Source: Ambulatory Visit | Attending: Obstetrics & Gynecology | Admitting: Obstetrics & Gynecology

## 2024-02-10 DIAGNOSIS — R921 Mammographic calcification found on diagnostic imaging of breast: Secondary | ICD-10-CM

## 2024-02-10 DIAGNOSIS — R928 Other abnormal and inconclusive findings on diagnostic imaging of breast: Secondary | ICD-10-CM | POA: Diagnosis not present

## 2024-02-10 DIAGNOSIS — R92323 Mammographic fibroglandular density, bilateral breasts: Secondary | ICD-10-CM | POA: Diagnosis not present

## 2024-02-18 ENCOUNTER — Ambulatory Visit
Admission: RE | Admit: 2024-02-18 | Discharge: 2024-02-18 | Disposition: A | Discharge status: Home / Self Care | Source: Ambulatory Visit | Attending: Obstetrics & Gynecology | Admitting: Obstetrics & Gynecology

## 2024-02-18 DIAGNOSIS — R921 Mammographic calcification found on diagnostic imaging of breast: Secondary | ICD-10-CM

## 2024-02-18 DIAGNOSIS — N6021 Fibroadenosis of right breast: Secondary | ICD-10-CM | POA: Diagnosis not present

## 2024-02-19 LAB — SURGICAL PATHOLOGY

## 2024-02-20 DIAGNOSIS — Z1331 Encounter for screening for depression: Secondary | ICD-10-CM | POA: Diagnosis not present

## 2024-02-20 DIAGNOSIS — I10 Essential (primary) hypertension: Secondary | ICD-10-CM | POA: Diagnosis not present

## 2024-02-20 DIAGNOSIS — E782 Mixed hyperlipidemia: Secondary | ICD-10-CM | POA: Diagnosis not present

## 2024-02-20 DIAGNOSIS — Z6827 Body mass index (BMI) 27.0-27.9, adult: Secondary | ICD-10-CM | POA: Diagnosis not present

## 2024-02-20 DIAGNOSIS — Z0001 Encounter for general adult medical examination with abnormal findings: Secondary | ICD-10-CM | POA: Diagnosis not present

## 2024-02-20 DIAGNOSIS — E663 Overweight: Secondary | ICD-10-CM | POA: Diagnosis not present

## 2024-02-20 DIAGNOSIS — E538 Deficiency of other specified B group vitamins: Secondary | ICD-10-CM | POA: Diagnosis not present

## 2024-02-20 DIAGNOSIS — H17811 Minor opacity of cornea, right eye: Secondary | ICD-10-CM | POA: Diagnosis not present

## 2024-02-20 DIAGNOSIS — R7309 Other abnormal glucose: Secondary | ICD-10-CM | POA: Diagnosis not present

## 2024-02-21 LAB — LIPID PANEL
Cholesterol: 176 (ref 0–200)
HDL: 47 (ref 35–70)
LDL Cholesterol: 111
Triglycerides: 101 (ref 40–160)

## 2024-02-21 LAB — HEMOGLOBIN A1C: Hemoglobin A1C: 6.2

## 2024-03-10 ENCOUNTER — Encounter: Payer: Self-pay | Admitting: Physician Assistant

## 2024-03-10 ENCOUNTER — Ambulatory Visit: Admitting: Physician Assistant

## 2024-03-10 VITALS — BP 128/78 | HR 68 | Temp 98.8°F | Ht 65.0 in | Wt 167.8 lb

## 2024-03-10 DIAGNOSIS — Z9889 Other specified postprocedural states: Secondary | ICD-10-CM | POA: Diagnosis not present

## 2024-03-10 DIAGNOSIS — I10 Essential (primary) hypertension: Secondary | ICD-10-CM | POA: Diagnosis not present

## 2024-03-10 DIAGNOSIS — Z9189 Other specified personal risk factors, not elsewhere classified: Secondary | ICD-10-CM

## 2024-03-10 DIAGNOSIS — Z7689 Persons encountering health services in other specified circumstances: Secondary | ICD-10-CM

## 2024-03-10 DIAGNOSIS — E785 Hyperlipidemia, unspecified: Secondary | ICD-10-CM | POA: Insufficient documentation

## 2024-03-10 DIAGNOSIS — E782 Mixed hyperlipidemia: Secondary | ICD-10-CM | POA: Diagnosis not present

## 2024-03-10 NOTE — Assessment & Plan Note (Signed)
 Patient presents today is 3 weeks s/p right breast biopsy with clip placement. She denies tenderness, swelling, or warmth. Incision site is mostly healed with a small scab. Minimal scar tissue palpated under incision site. No signs of acute infection. Reassurance given. Discussed supportive care. Advised patient to contact breast office for worsening symptoms. I except slow resolution of symptoms over time.

## 2024-03-10 NOTE — Progress Notes (Signed)
 New Patient Office Visit  Subjective    Patient ID: Martha Solomon, female    DOB: 1955-11-20  Age: 68 y.o. MRN: [id number]  CC: No chief complaint on file.   HPI Martha Solomon presents to establish care  Patient presents today with past medical history significant for hypertension, hyperlipidemia, and prediabetes. She has recently undergone diagnostic mammogram with right breast biopsy and clip placement. She reports feeling a lump under the incision site from breast biopsy. Denies pain, redness, swelling, discharge, or fevers. Additionally, she reports she is scheduled with endocrinology next month for workup of hypercalcemia. Patient up to date with colon cancer screening, reports recent Cologuard done at her previous PCP office. History of total hysterectomy, does not require additional Paps. Overdue for DEXA scan, Discussed vaccinations today.   Patient sending records from PCP office.   Outpatient Encounter Medications as of 03/10/2024  Medication Sig   acetaminophen  (TYLENOL ) 500 MG tablet Take 1,000 mg by mouth every 6 (six) hours as needed for moderate pain or headache.   ASTAXANTHIN PO Take 10 mg by mouth daily.    atorvastatin (LIPITOR) 80 MG tablet Take 80 mg by mouth daily.    brimonidine (ALPHAGAN) 0.2 % ophthalmic solution Place 1 drop into both eyes in the morning and at bedtime.   Cholecalciferol (VITAMIN D) 50 MCG (2000 UT) tablet Take 2,000 Units by mouth daily.   Coenzyme Q10 (COQ-10) 100 MG CAPS Take 100 mg by mouth daily.   [employer] LUTEIN-ZEAXANTHIN PO Take by mouth daily.   fluorometholone (EFLONE) 0.1 % ophthalmic suspension 1 drop in the morning and at bedtime.   fluticasone (FLONASE) 50 MCG/ACT nasal spray Place 1 spray into both nostrils daily as needed for allergies or rhinitis.   hydrochlorothiazide (MICROZIDE) 12.5 MG capsule Take 12.5 mg by mouth daily.   Krill Oil (MAXIMUM RED KRILL PO) Take 1,000 mg by mouth daily.   losartan (COZAAR) 100 MG tablet Take  100 mg by mouth daily.   multivitamin-lutein (OCUVITE-LUTEIN) CAPS capsule Take 1 capsule by mouth daily.   Polyethyl Glycol-Propyl Glycol (SYSTANE OP) Place 1 drop into both eyes in the morning, at noon, and at bedtime.   sodium chloride (OCEAN) 0.65 % SOLN nasal spray Place 1 spray into both nostrils as needed for congestion.   vitamin C (ASCORBIC ACID) 500 MG tablet Take 500 mg by mouth daily.    No facility-administered encounter medications on file as of 03/10/2024.    Past Medical History:  Diagnosis Date   Diabetes mellitus without complication ([facility])    pre-diabetes   High cholesterol    Hypertension     Past Surgical History:  Procedure Laterality Date   BREAST BIOPSY Right 02/18/2024   MM RT BREAST BX W LOC DEV 1ST LESION IMAGE BX SPEC STEREO GUIDE 02/18/2024 [facility]   CATARACT EXTRACTION W/PHACO Left 03/31/2020   Procedure: CATARACT EXTRACTION PHACO AND INTRAOCULAR LENS PLACEMENT ([facility]);  Surgeon: Harrie Agent, MD;  Location: [facility];  Service: Ophthalmology;  Laterality: Left;  CDE: 2.92   CATARACT EXTRACTION W/PHACO Right 04/14/2020   Procedure: CATARACT EXTRACTION PHACO AND INTRAOCULAR LENS PLACEMENT RIGHT EYE;  Surgeon: Harrie Agent, MD;  Location: [facility];  Service: Ophthalmology;  Laterality: Right;  CDE: 3.76   CHOLECYSTECTOMY     TOTAL ABDOMINAL HYSTERECTOMY      Family History  Problem Relation Age of Onset   Dementia Mother    Cancer Father        lung  Stroke Father    High Cholesterol Sister    Hypertension Sister    Cancer Paternal Aunt        ovarian, breast and pancreatic   Heart attack Paternal Grandfather    High Cholesterol Brother    HIV/AIDS Brother     Social History   Socioeconomic History   Marital status: Widowed    Spouse name: Not on file   Number of children: Not on file   Years of education: Not on file   Highest education level: Not on file  Occupational History   Not on file  Tobacco Use   Smoking status: Never    Smokeless tobacco: Never  Vaping Use   Vaping status: Never Used  Substance and Sexual Activity   Alcohol use: No    Alcohol/week: 0.0 standard drinks of alcohol    Comment: occassional   Drug use: No   Sexual activity: Not Currently    Birth control/protection: Surgical    Comment: hyst  Other Topics Concern   Not on file  Social History Narrative   Not on file   Social Drivers of Health   Financial Resource Strain: Low Risk  (10/07/2022)   Overall Financial Resource Strain (CARDIA)    Difficulty of Paying Living Expenses: Not hard at all  Food Insecurity: No Food Insecurity (10/07/2022)   Hunger Vital Sign    Worried About Running Out of Food in the Last Year: Never true    Ran Out of Food in the Last Year: Never true  Transportation Needs: No Transportation Needs (10/07/2022)   PRAPARE - Administrator, Civil Service (Medical): No    Lack of Transportation (Non-Medical): No  Physical Activity: Sufficiently Active (10/07/2022)   Exercise Vital Sign    Days of Exercise per Week: 7 days    Minutes of Exercise per Session: 30 min  Stress: No Stress Concern Present (10/07/2022)   [employer] of Occupational Health - Occupational Stress Questionnaire    Feeling of Stress : Only a little  Social Connections: Moderately Integrated (10/07/2022)   Social Connection and Isolation Panel    Frequency of Communication with Friends and Family: Three times a week    Frequency of Social Gatherings with Friends and Family: Three times a week    Attends Religious Services: More than 4 times per year    Active Member of Clubs or Organizations: Yes    Attends Banker Meetings: More than 4 times per year    Marital Status: Widowed  Intimate Partner Violence: Unknown (11/02/2021)   Received from [facility]   HITS    Physically Hurt: Not on file    Insult or Talk Down To: Not on file    Threaten Physical Harm: Not on file    Scream or Curse: Not on file     Review of Systems  Constitutional:  Negative for chills, fever and malaise/fatigue.  Eyes:  Negative for blurred vision and double vision.  Respiratory:  Negative for cough and shortness of breath.   Cardiovascular:  Negative for chest pain and palpitations.  Musculoskeletal:  Negative for joint pain and myalgias.  Neurological:  Negative for dizziness and headaches.  Psychiatric/Behavioral:  Negative for depression. The patient is not nervous/anxious.         Objective    BP 128/78   Pulse 68   Temp 98.8 F (37.1 C)   Ht 5' 5 (1.651 m)   Wt 167 lb 12.8 oz (76.1  kg)   SpO2 99%   BMI 27.92 kg/m   Physical Exam Constitutional:      Appearance: Normal appearance.  HENT:     Head: Normocephalic and atraumatic.     Mouth/Throat:     Mouth: Mucous membranes are moist.     Pharynx: Oropharynx is clear.  Eyes:     Extraocular Movements: Extraocular movements intact.     Conjunctiva/sclera: Conjunctivae normal.  Cardiovascular:     Rate and Rhythm: Normal rate and regular rhythm.     Heart sounds: Normal heart sounds. No murmur heard. Pulmonary:     Effort: Pulmonary effort is normal.     Breath sounds: Normal breath sounds. No wheezing or rales.  Chest:    Musculoskeletal:     Right lower leg: No edema.     Left lower leg: No edema.  Skin:    General: Skin is warm and dry.  Neurological:     General: No focal deficit present.     Mental Status: She is alert and oriented to person, place, and time.  Psychiatric:        Mood and Affect: Mood normal.        Behavior: Behavior normal.       Assessment & Plan:  Encounter to establish care  Primary hypertension Assessment & Plan: 128/78 Controlled. Continue current medications. No change in management. Discussed DASH diet and dietary sodium restrictions.  Continue dietary efforts and physical activity.    Mixed hyperlipidemia Assessment & Plan: Stable. Continue with current management without  changes. Discussed healthy diet and lifestyle.    S/P breast biopsy, right Assessment & Plan: Patient presents today is 3 weeks s/p right breast biopsy with clip placement. She denies tenderness, swelling, or warmth. Incision site is mostly healed with a small scab. Minimal scar tissue palpated under incision site. No signs of acute infection. Reassurance given. Discussed supportive care. Advised patient to contact breast office for worsening symptoms. I except slow resolution of symptoms over time.    At risk for decreased bone density -     DG Bone Density    Return in about 6 months (around 09/10/2024), or sooner as needed.   Charmaine Yariel Ferraris, PA-C

## 2024-03-10 NOTE — Assessment & Plan Note (Signed)
 128/78 Controlled. Continue current medications. No change in management. Discussed DASH diet and dietary sodium restrictions.  Continue dietary efforts and physical activity.

## 2024-03-10 NOTE — Assessment & Plan Note (Signed)
 Stable. Continue with current management without changes. Discussed healthy diet and lifestyle.

## 2024-03-17 ENCOUNTER — Ambulatory Visit (HOSPITAL_COMMUNITY)
Admission: RE | Admit: 2024-03-17 | Discharge: 2024-03-17 | Disposition: A | Discharge status: Home / Self Care | Source: Ambulatory Visit | Attending: Physician Assistant | Admitting: Physician Assistant

## 2024-03-17 DIAGNOSIS — Z9189 Other specified personal risk factors, not elsewhere classified: Secondary | ICD-10-CM | POA: Diagnosis present

## 2024-03-17 DIAGNOSIS — Z13820 Encounter for screening for osteoporosis: Secondary | ICD-10-CM | POA: Diagnosis not present

## 2024-03-17 DIAGNOSIS — Z780 Asymptomatic menopausal state: Secondary | ICD-10-CM | POA: Diagnosis not present

## 2024-03-17 DIAGNOSIS — M8589 Other specified disorders of bone density and structure, multiple sites: Secondary | ICD-10-CM | POA: Insufficient documentation

## 2024-03-17 DIAGNOSIS — M85852 Other specified disorders of bone density and structure, left thigh: Secondary | ICD-10-CM | POA: Diagnosis not present

## 2024-03-21 ENCOUNTER — Ambulatory Visit: Payer: Self-pay | Admitting: Physician Assistant

## 2024-04-01 ENCOUNTER — Ambulatory Visit: Admitting: "Endocrinology

## 2024-04-01 ENCOUNTER — Encounter: Payer: Self-pay | Admitting: "Endocrinology

## 2024-04-01 DIAGNOSIS — E559 Vitamin D deficiency, unspecified: Secondary | ICD-10-CM | POA: Insufficient documentation

## 2024-04-01 DIAGNOSIS — M85859 Other specified disorders of bone density and structure, unspecified thigh: Secondary | ICD-10-CM | POA: Insufficient documentation

## 2024-04-01 DIAGNOSIS — E782 Mixed hyperlipidemia: Secondary | ICD-10-CM

## 2024-04-01 DIAGNOSIS — M8580 Other specified disorders of bone density and structure, unspecified site: Secondary | ICD-10-CM | POA: Diagnosis not present

## 2024-04-01 DIAGNOSIS — R7303 Prediabetes: Secondary | ICD-10-CM | POA: Insufficient documentation

## 2024-04-01 NOTE — Progress Notes (Signed)
 Endocrinology Consult Note                                            04/01/2024, 11:06 AM   Subjective:    Patient ID: Martha Solomon, female    DOB: 22-Aug-1955, PCP Grooms, Charmaine, [place]   Past Medical History:  Diagnosis Date   Diabetes mellitus without complication (HCC)    pre-diabetes   High cholesterol    Hypertension    Past Surgical History:  Procedure Laterality Date   BREAST BIOPSY Right 02/18/2024   MM RT BREAST BX W LOC DEV 1ST LESION IMAGE BX SPEC STEREO GUIDE 02/18/2024 [facility]   CATARACT EXTRACTION W/PHACO Left 03/31/2020   Procedure: CATARACT EXTRACTION PHACO AND INTRAOCULAR LENS PLACEMENT (IOC);  Surgeon: Harrie Agent, MD;  Location: [facility];  Service: Ophthalmology;  Laterality: Left;  CDE: 2.92   CATARACT EXTRACTION W/PHACO Right 04/14/2020   Procedure: CATARACT EXTRACTION PHACO AND INTRAOCULAR LENS PLACEMENT RIGHT EYE;  Surgeon: Harrie Agent, MD;  Location: [facility];  Service: Ophthalmology;  Laterality: Right;  CDE: 3.76   CHOLECYSTECTOMY     TOTAL ABDOMINAL HYSTERECTOMY     Social History   Socioeconomic History   Marital status: Widowed    Spouse name: Not on file   Number of children: Not on file   Years of education: Not on file   Highest education level: Not on file  Occupational History   Not on file  Tobacco Use   Smoking status: Never   Smokeless tobacco: Never  Vaping Use   Vaping status: Never Used  Substance and Sexual Activity   Alcohol use: No    Alcohol/week: 0.0 standard drinks of alcohol    Comment: occassional   Drug use: No   Sexual activity: Not Currently    Birth control/protection: Surgical    Comment: hyst  Other Topics Concern   Not on file  Social History Narrative   Not on file   Social Drivers of Health   Financial Resource Strain: Low Risk  (10/07/2022)   Overall Financial Resource Strain (CARDIA)    Difficulty of Paying Living Expenses: Not hard at all  Food Insecurity: No Food  Insecurity (10/07/2022)   Hunger Vital Sign    Worried About Running Out of Food in the Last Year: Never true    Ran Out of Food in the Last Year: Never true  Transportation Needs: No Transportation Needs (10/07/2022)   PRAPARE - Administrator, Civil Service (Medical): No    Lack of Transportation (Non-Medical): No  Physical Activity: Sufficiently Active (10/07/2022)   Exercise Vital Sign    Days of Exercise per Week: 7 days    Minutes of Exercise per Session: 30 min  Stress: No Stress Concern Present (10/07/2022)   [employer] of Occupational Health - Occupational Stress Questionnaire    Feeling of Stress : Only a little  Social Connections: Moderately Integrated (10/07/2022)   Social Connection and Isolation Panel    Frequency of Communication with Friends and Family: Three times a week    Frequency of Social Gatherings with Friends and Family: Three times a week    Attends Religious Services: More than 4 times per year    Active Member of Clubs or Organizations: Yes    Attends Banker Meetings: More than 4 times per  year    Marital Status: Widowed   Family History  Problem Relation Age of Onset   Dementia Mother    Cancer Father        lung   Stroke Father    High Cholesterol Sister    Hypertension Sister    Cancer Paternal Aunt        ovarian, breast and pancreatic   Heart attack Paternal Grandfather    High Cholesterol Brother    HIV/AIDS Brother    Outpatient Encounter Medications as of 04/01/2024  Medication Sig   acetaminophen  (TYLENOL ) 500 MG tablet Take 1,000 mg by mouth every 6 (six) hours as needed for moderate pain or headache.   ASTAXANTHIN PO Take 10 mg by mouth daily.  (Patient not taking: Reported on 04/01/2024)   atorvastatin (LIPITOR) 80 MG tablet Take 80 mg by mouth daily.    brimonidine (ALPHAGAN) 0.2 % ophthalmic solution Place 1 drop into both eyes in the morning and at bedtime.   Cholecalciferol (VITAMIN D) 50 MCG (2000 UT)  tablet Take 2,000 Units by mouth daily.   Coenzyme Q10 (COQ-10) 100 MG CAPS Take 100 mg by mouth daily.   [employer] LUTEIN-ZEAXANTHIN PO Take by mouth daily.   fluorometholone (EFLONE) 0.1 % ophthalmic suspension 1 drop in the morning and at bedtime.   fluticasone (FLONASE) 50 MCG/ACT nasal spray Place 1 spray into both nostrils daily as needed for allergies or rhinitis.   hydrochlorothiazide (MICROZIDE) 12.5 MG capsule Take 12.5 mg by mouth daily.   Krill Oil (MAXIMUM RED KRILL PO) Take 1,000 mg by mouth daily.   losartan (COZAAR) 100 MG tablet Take 100 mg by mouth daily.   multivitamin-lutein (OCUVITE-LUTEIN) CAPS capsule Take 1 capsule by mouth daily. (Patient not taking: Reported on 04/01/2024)   Polyethyl Glycol-Propyl Glycol (SYSTANE OP) Place 1 drop into both eyes in the morning, at noon, and at bedtime.   sodium chloride (OCEAN) 0.65 % SOLN nasal spray Place 1 spray into both nostrils as needed for congestion.   vitamin C (ASCORBIC ACID) 500 MG tablet Take 500 mg by mouth daily.    No facility-administered encounter medications on file as of 04/01/2024.   ALLERGIES: Allergies  Allergen Reactions   Reglan [Metoclopramide] Other (See Comments)    Symptoms of stroke-paralyzed    VACCINATION STATUS: Immunization History  Administered Date(s) Administered   Moderna Sars-Covid-2 Vaccination 10/14/2019, 11/12/2019, 06/15/2020, 11/23/2020    HPI Martha Solomon is 68 y.o. female who presents today with a medical history as above. she is being seen in consultation for hypercalcemia requested by Grooms, Charmaine, [place].  History is obtained directly from the patient as well as her chart review. She denies any prior history of thyroid /parathyroid dysfunction she was found to have mild hypercalcemia of 10.9 on February 21, 2024.  She denies any prior history of nephrolithiasis.  She has osteopenia based on her recent bone density.  She is not taking excessive calcium supplements nor vitamin D  supplements. Her other medical problems include depression, high cholesterol for which she is taking medications including Lipitor, hydrochlorothiazide and Cozaar.  She is also on vitamin D 2000 units daily.  She does not have family history of parathyroid syndromes. She denies any height loss.  Is active at baseline. She does not have acute complaints today.  She has prediabetes with recent A1c of 6.2%.  Review of Systems  Constitutional: +mildly fluctuating body weight, no fatigue, no subjective hyperthermia, no subjective hypothermia Eyes: no blurry vision, no xerophthalmia  ENT: no sore throat, no nodules palpated in throat, no dysphagia/odynophagia, no hoarseness Cardiovascular: no Chest Pain, no Shortness of Breath, no palpitations, no leg swelling Respiratory: no cough, no shortness of breath Gastrointestinal: no Nausea/Vomiting/Diarhhea Musculoskeletal: no muscle/joint aches Skin: no rashes Neurological: no tremors, no numbness, no tingling, no dizziness Psychiatric: no depression, no anxiety  Objective:       04/01/2024    8:05 AM 03/10/2024    8:06 AM 10/17/2022    9:25 AM  Vitals with BMI  Height 5' 5 5' 5 5' 5  Weight 167 lbs 6 oz 167 lbs 13 oz 173 lbs  BMI 27.86 27.92 28.79  Systolic 124 128 862  Diastolic 78 78 80  Pulse 64 68 71    BP 124/78   Pulse 64   Ht 5' 5 (1.651 m)   Wt 167 lb 6.4 oz (75.9 kg)   BMI 27.86 kg/m   Wt Readings from Last 3 Encounters:  04/01/24 167 lb 6.4 oz (75.9 kg)  03/10/24 167 lb 12.8 oz (76.1 kg)  10/17/22 173 lb (78.5 kg)    Physical Exam  Constitutional:  Body mass index is 27.86 kg/m.,  not in acute distress, normal state of mind Eyes: PERRLA, EOMI, no exophthalmos ENT: moist mucous membranes, no gross thyromegaly, no gross cervical lymphadenopathy Cardiovascular: normal precordial activity, Regular Rate and Rhythm, no Murmur/Rubs/Gallops Respiratory:  adequate breathing efforts, no gross chest deformity, Clear to  auscultation bilaterally Gastrointestinal: abdomen soft, Non -tender, No distension, Bowel Sounds present, no gross organomegaly Musculoskeletal: no gross deformities, strength intact in all four extremities, no peripheral edema Skin: moist, warm, no rashes Neurological: no tremor with outstretched hands, Deep tendon reflexes normal in bilateral lower extremities.  CMP ( most recent) CMP     Component Value Date/Time   NA 138 03/29/2020 1109   K 4.1 03/29/2020 1109   CL 102 03/29/2020 1109   CO2 24 03/29/2020 1109   GLUCOSE 109 (H) 03/29/2020 1109   BUN 14 03/29/2020 1109   CREATININE 0.96 03/29/2020 1109   CALCIUM 10.1 03/29/2020 1109   GFRNONAA >60 03/29/2020 1109   February 21, 2024 calcium 10.9  Lipid Panel     Component Value Date/Time   CHOL 176 02/21/2024 0000   TRIG 101 02/21/2024 0000   HDL 47 02/21/2024 0000   LDLCALC 111 02/21/2024 0000     Assessment & Plan:   1. Hypercalcemia (Primary) 2. Osteopenia of hip, unspecified laterality 3. Prediabetes 4. Mixed hyperlipidemia  - CHERESE LOZANO  is being seen at a kind request of Grooms, Charmaine, [place]. - I have reviewed her available  records and clinically evaluated the patient. - Based on these reviews, she has hypercalcemia 10.9 not associated with any measurement of PTH normal PTH RP.  This is not sufficient information to proceed with any definitive treatment plan. - she will need a repeat,  more complete assessment towards confirming the diagnosis.  All of her labs referral packet showed excessive folate, will need to have a repeat lab assessment of folate.   -She will be sent to lab for the following labs: - Comprehensive metabolic panel with GFR - PTH, intact and calcium - Magnesium - Phosphorus - Calcium, urine, 24 hour; Future - Creatinine, urine, 24 hour; Future - PTH-related peptide - Folate  - Her bone density from March 17, 2024 was consistent with osteopenia. If she is confirmed to have primary  hyperparathyroidism, she is not a surgical candidate. - She is encouraged to  continue her vitamin D supplement,  And continue on her medications for hypertension and hyperlipidemia.    - I did not initiate any new prescriptions today. - she is advised to maintain close follow up with Grooms, Charmaine, PA-C for primary care needs.   -Thank you for involving me in the care of this pleasant patient.  Time spent with the patient: 46  minutes spent in  counseling her about hypercalcemia, osteopenia, and the rest in obtaining information about her symptoms, reviewing her previous labs/studies (including abstractions from other facilities),  evaluations, and treatments,  and developing a plan to confirm diagnosis and long term treatment based on the latest standards of care/guidelines; and documenting her care.  Martha Solomon participated in the discussions, expressed understanding, and voiced agreement with the above plans.  All questions were answered to her satisfaction. she is encouraged to contact clinic should she have any questions or concerns prior to her return visit.  Follow up plan: Return in about 2 weeks (around 04/15/2024) for F/U with Pre-visit Labs, 24 Hr Urine Ca & Cr.   Ranny Earl, MD [facility] [address] [place], [place] [address] Phone: [phone]  Fax: [phone]     04/01/2024, 11:06 AM  This note was partially dictated with voice recognition software. Similar sounding words can be transcribed inadequately or may not  be corrected upon review.

## 2024-04-12 ENCOUNTER — Other Ambulatory Visit: Payer: Self-pay | Admitting: Physician Assistant

## 2024-04-13 LAB — CREATININE, URINE, 24 HOUR
Creatinine, 24H Ur: 1138 mg/(24.h) (ref 800–1800)
Creatinine, Urine: 84.3 mg/dL

## 2024-04-13 LAB — CALCIUM, URINE, 24 HOUR
Calcium, 24H Urine: <11 mg/(24.h) (ref 0–320)
Calcium, Urine: <0.8 mg/dL

## 2024-04-14 ENCOUNTER — Other Ambulatory Visit: Payer: Self-pay | Admitting: Physician Assistant

## 2024-04-14 MED ORDER — HYDROCHLOROTHIAZIDE 12.5 MG PO CAPS: 12.5000 mg | ORAL_CAPSULE | Freq: Every day | ORAL | 0 refills | Status: DC

## 2024-04-14 NOTE — Telephone Encounter (Signed)
 Copied from CRM [id number]. Topic: Clinical - Medication Refill >> Apr 14, 2024  8:03 AM Carlatta H wrote: Medication: atorvastatin (LIPITOR) 80 MG tablet hydrochlorothiazide  (MICROZIDE ) 12.5 MG capsule losartan (COZAAR) 100 MG tablet   Has the patient contacted their pharmacy? Yes (Agent: If no, request that the patient contact the pharmacy for the refill. If patient does not wish to contact the pharmacy document the reason why and proceed with request.) (Agent: If yes, when and what did the pharmacy advise?)Contact office  This is the patient's preferred pharmacy:  [facility] - [place], [place] - [address] [place] [address] Phone: [phone] Fax: [phone]  Is this the correct pharmacy for this prescription? Yes If no, delete pharmacy and type the correct one.   Has the prescription been filled recently? No  Is the patient out of the medication? Yes  Has the patient been seen for an appointment in the last year OR does the patient have an upcoming appointment? Yes  Can we respond through MyChart? No  Agent: Please be advised that Rx refills may take up to 3 business days. We ask that you follow-up with your pharmacy.

## 2024-04-18 LAB — PTH, INTACT AND CALCIUM: PTH: 32 pg/mL (ref 15–65)

## 2024-04-18 LAB — COMPREHENSIVE METABOLIC PANEL WITH GFR
ALT: 39 IU/L — ABNORMAL HIGH (ref 0–32)
AST: 32 IU/L (ref 0–40)
Albumin: 4.6 g/dL (ref 3.9–4.9)
Alkaline Phosphatase: 75 IU/L (ref 51–125)
BUN/Creatinine Ratio: 17 (ref 12–28)
BUN: 16 mg/dL (ref 8–27)
Bilirubin Total: 0.4 mg/dL (ref 0.0–1.2)
CO2: 24 mmol/L (ref 20–29)
Calcium: 10.2 mg/dL (ref 8.7–10.3)
Chloride: 103 mmol/L (ref 96–106)
Creatinine, Ser: 0.93 mg/dL (ref 0.57–1.00)
Globulin, Total: 2.7 g/dL (ref 1.5–4.5)
Glucose: 104 mg/dL — ABNORMAL HIGH (ref 70–99)
Potassium: 4.3 mmol/L (ref 3.5–5.2)
Sodium: 144 mmol/L (ref 134–144)
Total Protein: 7.3 g/dL (ref 6.0–8.5)
eGFR: 67 mL/min/1.73 (ref >59)

## 2024-04-18 LAB — PTH-RELATED PEPTIDE: PTH-related peptide: <2 pmol/L

## 2024-04-18 LAB — MAGNESIUM: Magnesium: 2.1 mg/dL (ref 1.6–2.3)

## 2024-04-18 LAB — FOLATE: Folate: 17.9 ng/mL (ref >3.0)

## 2024-04-18 LAB — PHOSPHORUS: Phosphorus: 3.8 mg/dL (ref 3.0–4.3)

## 2024-04-21 ENCOUNTER — Ambulatory Visit: Admitting: "Endocrinology

## 2024-04-21 ENCOUNTER — Encounter: Payer: Self-pay | Admitting: "Endocrinology

## 2024-04-21 DIAGNOSIS — R7303 Prediabetes: Secondary | ICD-10-CM

## 2024-04-21 DIAGNOSIS — M85859 Other specified disorders of bone density and structure, unspecified thigh: Secondary | ICD-10-CM | POA: Diagnosis not present

## 2024-04-21 NOTE — Progress Notes (Signed)
 04/21/2024, 9:12 AM  Endocrinology follow-up note   Subjective:    Patient ID: Martha Solomon, female    DOB: Dec 06, 1955, PCP Grooms, Charmaine, [place]   Past Medical History:  Diagnosis Date   Diabetes mellitus without complication (HCC)    pre-diabetes   High cholesterol    Hypertension    Past Surgical History:  Procedure Laterality Date   BREAST BIOPSY Right 02/18/2024   MM RT BREAST BX W LOC DEV 1ST LESION IMAGE BX SPEC STEREO GUIDE 02/18/2024 [facility]   CATARACT EXTRACTION W/PHACO Left 03/31/2020   Procedure: CATARACT EXTRACTION PHACO AND INTRAOCULAR LENS PLACEMENT (IOC);  Surgeon: Harrie Agent, MD;  Location: [facility];  Service: Ophthalmology;  Laterality: Left;  CDE: 2.92   CATARACT EXTRACTION W/PHACO Right 04/14/2020   Procedure: CATARACT EXTRACTION PHACO AND INTRAOCULAR LENS PLACEMENT RIGHT EYE;  Surgeon: Harrie Agent, MD;  Location: [facility];  Service: Ophthalmology;  Laterality: Right;  CDE: 3.76   CHOLECYSTECTOMY     TOTAL ABDOMINAL HYSTERECTOMY     Social History   Socioeconomic History   Marital status: Widowed    Spouse name: Not on file   Number of children: Not on file   Years of education: Not on file   Highest education level: Not on file  Occupational History   Not on file  Tobacco Use   Smoking status: Never   Smokeless tobacco: Never  Vaping Use   Vaping status: Never Used  Substance and Sexual Activity   Alcohol use: No    Alcohol/week: 0.0 standard drinks of alcohol    Comment: occassional   Drug use: No   Sexual activity: Not Currently    Birth control/protection: Surgical    Comment: hyst  Other Topics Concern   Not on file  Social History Narrative   Not on file   Social Drivers of Health   Financial Resource Strain: Low Risk  (10/07/2022)   Overall Financial Resource Strain (CARDIA)    Difficulty of Paying Living Expenses: Not hard at all  Food Insecurity: No Food  Insecurity (10/07/2022)   Hunger Vital Sign    Worried About Running Out of Food in the Last Year: Never true    Ran Out of Food in the Last Year: Never true  Transportation Needs: No Transportation Needs (10/07/2022)   PRAPARE - Administrator, Civil Service (Medical): No    Lack of Transportation (Non-Medical): No  Physical Activity: Sufficiently Active (10/07/2022)   Exercise Vital Sign    Days of Exercise per Week: 7 days    Minutes of Exercise per Session: 30 min  Stress: No Stress Concern Present (10/07/2022)   [employer] of Occupational Health - Occupational Stress Questionnaire    Feeling of Stress : Only a little  Social Connections: Moderately Integrated (10/07/2022)   Social Connection and Isolation Panel    Frequency of Communication with Friends and Family: Three times a week    Frequency of Social Gatherings with Friends and Family: Three times a week    Attends Religious Services: More than 4 times per year    Active Member of Clubs or Organizations: Yes    Attends Banker Meetings: More than 4 times  per year    Marital Status: Widowed   Family History  Problem Relation Age of Onset   Dementia Mother    Cancer Father        lung   Stroke Father    High Cholesterol Sister    Hypertension Sister    Cancer Paternal Aunt        ovarian, breast and pancreatic   Heart attack Paternal Grandfather    High Cholesterol Brother    HIV/AIDS Brother    Outpatient Encounter Medications as of 04/21/2024  Medication Sig   acetaminophen  (TYLENOL ) 500 MG tablet Take 1,000 mg by mouth every 6 (six) hours as needed for moderate pain or headache.   ASTAXANTHIN PO Take 10 mg by mouth daily.  (Patient not taking: Reported on 04/01/2024)   atorvastatin (LIPITOR) 80 MG tablet TAKE 1 TABLET BY MOUTH DAILY   brimonidine (ALPHAGAN) 0.2 % ophthalmic solution Place 1 drop into both eyes in the morning and at bedtime.   Cholecalciferol (VITAMIN D) 50 MCG (2000 UT)  tablet Take 2,000 Units by mouth daily.   Coenzyme Q10 (COQ-10) 100 MG CAPS Take 100 mg by mouth daily.   [employer] LUTEIN-ZEAXANTHIN PO Take by mouth daily.   fluorometholone (EFLONE) 0.1 % ophthalmic suspension 1 drop in the morning and at bedtime.   fluticasone (FLONASE) 50 MCG/ACT nasal spray Place 1 spray into both nostrils daily as needed for allergies or rhinitis.   hydrochlorothiazide  (MICROZIDE ) 12.5 MG capsule Take 1 capsule (12.5 mg total) by mouth daily. Take 12.5 mg by mouth daily.   Krill Oil (MAXIMUM RED KRILL PO) Take 1,000 mg by mouth daily.   losartan (COZAAR) 100 MG tablet TAKE 1 TABLET BY MOUTH DAILY   multivitamin-lutein (OCUVITE-LUTEIN) CAPS capsule Take 1 capsule by mouth daily. (Patient not taking: Reported on 04/01/2024)   Polyethyl Glycol-Propyl Glycol (SYSTANE OP) Place 1 drop into both eyes in the morning, at noon, and at bedtime.   sodium chloride (OCEAN) 0.65 % SOLN nasal spray Place 1 spray into both nostrils as needed for congestion.   vitamin C (ASCORBIC ACID) 500 MG tablet Take 500 mg by mouth daily.    No facility-administered encounter medications on file as of 04/21/2024.   ALLERGIES: Allergies  Allergen Reactions   Reglan [Metoclopramide] Other (See Comments)    Symptoms of stroke-paralyzed    VACCINATION STATUS: Immunization History  Administered Date(s) Administered   Moderna Sars-Covid-2 Vaccination 10/14/2019, 11/12/2019, 06/15/2020, 11/23/2020    HPI Martha Solomon is 68 y.o. female who presents today with a medical history as above. she is being seen in follow-up after she was seen in consultation for hypercalcemia requested by Grooms, Charmaine, PA-C.  -See notes from her last visit. She denies any prior history of thyroid /parathyroid dysfunction she was found to have mild hypercalcemia of 10.9 on February 21, 2024.   Her repeat labs showed normal calcium of 10.07 with normal PTH of 32.  She also has normal hypercalciuria. She denies any prior history  of nephrolithiasis.  She has osteopenia based on her recent bone density.  She is not taking excessive calcium supplements nor vitamin D supplements. Her other medical problems include depression, high cholesterol for which she is taking medications including Lipitor, hydrochlorothiazide  and Cozaar.  She is also on vitamin D 2000 units daily.  She does not have family history of parathyroid syndromes. She denies any height loss.  She is active at baseline. She does not have acute complaints today.  She has prediabetes  with recent A1c of 6.2%.  Review of Systems  Constitutional: +mildly fluctuating body weight, no fatigue, no subjective hyperthermia, no subjective hypothermia Eyes: no blurry vision, no xerophthalmia ENT: no sore throat, no nodules palpated in throat, no dysphagia/odynophagia, no hoarseness   Objective:       04/21/2024    8:40 AM 04/01/2024    8:05 AM 03/10/2024    8:06 AM  Vitals with BMI  Height 5' 5 5' 5 5' 5  Weight 164 lbs 167 lbs 6 oz 167 lbs 13 oz  BMI 27.29 27.86 27.92  Systolic 128 124 871  Diastolic 66 78 78  Pulse 68 64 68    BP 128/66   Pulse 68   Ht 5' 5 (1.651 m)   Wt 164 lb (74.4 kg)   BMI 27.29 kg/m   Wt Readings from Last 3 Encounters:  04/21/24 164 lb (74.4 kg)  04/01/24 167 lb 6.4 oz (75.9 kg)  03/10/24 167 lb 12.8 oz (76.1 kg)    Physical Exam  Constitutional:  Body mass index is 27.29 kg/m.,  not in acute distress, normal state of mind Eyes: PERRLA, EOMI, no exophthalmos, + right corneal opacity ENT: moist mucous membranes, no gross thyromegaly, no gross cervical lymphadenopathy Cardiovascular: normal precordial activity, Regular Rate and Rhythm, no Murmur/Rubs/Gallops   CMP ( most recent) CMP     Component Value Date/Time   NA 144 04/12/2024 0803   K 4.3 04/12/2024 0803   CL 103 04/12/2024 0803   CO2 24 04/12/2024 0803   GLUCOSE 104 (H) 04/12/2024 0803   GLUCOSE 109 (H) 03/29/2020 1109   BUN 16 04/12/2024 0803    CREATININE 0.93 04/12/2024 0803   CALCIUM 10.2 04/12/2024 0803   PROT 7.3 04/12/2024 0803   ALBUMIN 4.6 04/12/2024 0803   AST 32 04/12/2024 0803   ALT 39 (H) 04/12/2024 0803   ALKPHOS 75 04/12/2024 0803   BILITOT 0.4 04/12/2024 0803   EGFR 67 04/12/2024 0803   GFRNONAA >60 03/29/2020 1109   February 21, 2024 calcium 10.9  Lipid Panel     Component Value Date/Time   CHOL 176 02/21/2024 0000   TRIG 101 02/21/2024 0000   HDL 47 02/21/2024 0000   LDLCALC 111 02/21/2024 0000    Recent Results (from the past 2160 hours)  Surgical pathology     Status: None   Collection Time: 02/18/24 12:00 AM  Result Value Ref Range   SURGICAL PATHOLOGY      SURGICAL PATHOLOGY [facility] [address] [place], [place] [address] Telephone [phone] or [phone] Fax [phone]  REPORT OF SURGICAL PATHOLOGY   Accession #: [id number] Patient Name: JILLIANNA, STANEK Visit # : [id number]  MRN: [id number] Physician: Arceo, Dina DOB/Age [birth year] (Age: 8) Gender: F Collected Date: 02/18/2024 Received Date: 02/18/2024  FINAL DIAGNOSIS       1. Breast, right, needle core biopsy, upper :       - FIBROADENOMATOID CHANGE / FIBROUS STROMA AND SCLEROSING ADENOSIS WITH      ASSOCIATED CALCIFICATIONS.      - AREA OF REMOTE ORGANIZED FAT NECROSIS.      - NEGATIVE FOR ATYPIA AND MALIGNANCY.       DATE SIGNED OUT: 02/19/2024 ELECTRONIC SIGNATURE : Janel Md, Rexene , Pathologist, Electronic Signature  MICROSCOPIC DESCRIPTION  CASE COMMENTS STAINS USED IN DIAGNOSIS: H&E-2 H&E-3 H&E-4 H&E H&E-2 H&E-3 H&E-4 H&E H&E-2 H&E-3 H&E-4 H&E H&E-2 H&E-3 H&E-4 H&E    CLINICAL H ISTORY  SPECIMEN(S) OBTAINED 1. Breast, right, needle core biopsy, Upper  SPECIMEN COMMENTS: 1. TIF: 8 AM, CIT < 5 min; calcifications SPECIMEN CLINICAL INFORMATION: 1. R/O benign vs DCIS    Gross Description 1. Received labeled with the patient's name and right  breast upper calcs are multiple yellow-tan needle core biopsy fragments measuring 3.1 x 1.6 x 0.9 cm in aggregate.  The cores with microcalcifications are submitted in 1A-1C.  The cores without microcalcifications are submitted in 1D.      Time in formalin 8:00 a.m. on 02/18/24.  CIT less than 5 minutes.  (WC:kh      02/18/24)        Report signed out from the following location(s) [facility] [address] [place], [place] [address] CLIA #: [id number]  [facility] [address] [place], [place] [address] CLIA #: [id number]   Lipid panel     Status: None   Collection Time: 02/21/24 12:00 AM  Result Value Ref Range   Triglycerides 101 40 - 160   Cholesterol 176 0 - 200   HDL 47 35 - 70   LDL Cholesterol 111   Hemoglobin A1c     Status: None   Collection Time: 02/21/24 12:00 AM  Result Value Ref Range   Hemoglobin A1C 6.2   Comprehensive metabolic panel with GFR     Status: Abnormal   Collection Time: 04/12/24  8:03 AM  Result Value Ref Range   Glucose 104 (H) 70 - 99 mg/dL   BUN 16 8 - 27 mg/dL   Creatinine, Ser 9.06 0.57 - 1.00 mg/dL   eGFR 67 >40 fO/fpw/8.26   BUN/Creatinine Ratio 17 12 - 28   Sodium 144 134 - 144 mmol/L   Potassium 4.3 3.5 - 5.2 mmol/L   Chloride 103 96 - 106 mmol/L   CO2 24 20 - 29 mmol/L   Calcium 10.2 8.7 - 10.3 mg/dL   Total Protein 7.3 6.0 - 8.5 g/dL   Albumin 4.6 3.9 - 4.9 g/dL   Globulin, Total 2.7 1.5 - 4.5 g/dL   Bilirubin Total 0.4 0.0 - 1.2 mg/dL   Alkaline Phosphatase 75 51 - 125 IU/L    Comment:               **Please note reference interval change**   AST 32 0 - 40 IU/L   ALT 39 (H) 0 - 32 IU/L  PTH, intact and calcium     Status: None   Collection Time: 04/12/24  8:03 AM  Result Value Ref Range   PTH 32 15 - 65 pg/mL   PTH Interp Comment     Comment: Interpretation                 Intact PTH    Calcium                                 (pg/mL)      (mg/dL) Normal                          15  - 65     8.6 - 10.2 Primary Hyperparathyroidism         >65          >10.2 Secondary Hyperparathyroidism       >65          <10.2 Non-Parathyroid Hypercalcemia       <  65          >10.2 Hypoparathyroidism                  <15          < 8.6 Non-Parathyroid Hypocalcemia    15 - 65          < 8.6   Magnesium     Status: None   Collection Time: 04/12/24  8:03 AM  Result Value Ref Range   Magnesium 2.1 1.6 - 2.3 mg/dL  Phosphorus     Status: None   Collection Time: 04/12/24  8:03 AM  Result Value Ref Range   Phosphorus 3.8 3.0 - 4.3 mg/dL  PTH-related peptide     Status: None   Collection Time: 04/12/24  8:03 AM  Result Value Ref Range   PTH-related peptide <2.0 pmol/L    Comment: This test was developed and its performance characteristics determined by [facility]. It has not been cleared or approved by the Food and Drug Administration. Reference Range: All Ages: <2.0 The PTHrP assay should not be used to exclude cancer or screen tumor patients for humoral hypercalcemia of malignancy (HHM). The results should always be assessed in conjunction with the patient's medical history, clinical examination, and other findings. If test results are clinically discordant, please contact the laboratory.   Folate     Status: None   Collection Time: 04/12/24  8:03 AM  Result Value Ref Range   Folate 17.9 >3.0 ng/mL    Comment: A serum folate concentration of less than 3.1 ng/mL is considered to represent clinical deficiency.   Calcium, urine, 24 hour     Status: None   Collection Time: 04/12/24  2:06 PM  Result Value Ref Range   Calcium, Urine <0.8 Not Estab. mg/dL   Calcium, 75Y Urine <88 0 - 320 mg/24 hr  Creatinine, urine, 24 hour     Status: None   Collection Time: 04/12/24  2:12 PM  Result Value Ref Range   Creatinine, Urine 84.3 Not Estab. mg/dL   Creatinine, 75Y Ur 8,861 800 - 1,800 mg/24 hr    Assessment & Plan:   1. Hypercalcemia -resolved 2. Osteopenia of hip, unspecified  laterality 3. Prediabetes 4. Mixed hyperlipidemia  - KERSTIE AGENT  is being seen at a kind request of Grooms, Charmaine, [place]. - I have reviewed her new and available  records and clinically evaluated the patient. - Based on these reviews, she has normal calcium 10.2 associated with PTH of 32 which rules out primary hyperparathyroidism.   Her PTH RP is undetectable.  She has no hypercalciuria. This is not sufficient information to proceed with any definitive treatment plan. She is advised to continue on vitamin D3 2000 units daily, follow-up on calcium supplements for now.  She has normal phosphorus and magnesium. Her folate is now normal at 17.9.  - Her bone density from March 17, 2024 was consistent with osteopenia.  I had a long discussion about osteoporosis with her.  She will need a repeat bone density in August 2027.  - She is encouraged to continue her vitamin D supplement,  And continue on her medications for hypertension and hyperlipidemia. She will return in a year with repeat PTH/calcium, magnesium, phosphorus and vitamin D.  - she is advised to maintain close follow up with Grooms, Charmaine, PA-C for primary care needs.  I spent  22  minutes in the care of the patient today including review of labs from Thyroid  Function,  CMP, and other relevant labs ; imaging/biopsy records (current and previous including abstractions from other facilities); face-to-face time discussing  her lab results and symptoms, medications doses, her options of short and long term treatment based on the latest standards of care / guidelines;   and documenting the encounter.  Martha Solomon  participated in the discussions, expressed understanding, and voiced agreement with the above plans.  All questions were answered to her satisfaction. she is encouraged to contact clinic should she have any questions or concerns prior to her return visit.   Follow up plan: Return in about 1 year (around 04/21/2025)  for F/U with Pre-visit Labs.   Ranny Earl, MD [facility] [address] [place], [place] [address] Phone: [phone]  Fax: [phone]     04/21/2024, 9:12 AM  This note was partially dictated with voice recognition software. Similar sounding words can be transcribed inadequately or may not  be corrected upon review.

## 2024-07-15 ENCOUNTER — Other Ambulatory Visit: Payer: Self-pay | Admitting: Physician Assistant

## 2024-09-10 ENCOUNTER — Ambulatory Visit: Admitting: Physician Assistant

## 2025-04-21 ENCOUNTER — Ambulatory Visit: Admitting: "Endocrinology
